# Patient Record
Sex: Male | Born: 1980 | Race: Black or African American | Hispanic: No | Marital: Single | Smoking: Current every day smoker
Health system: Southern US, Community
[De-identification: ages and names within clinical notes are randomized; demographics above are authoritative.]

## PROBLEM LIST (undated history)

## (undated) DIAGNOSIS — K649 Unspecified hemorrhoids: Secondary | ICD-10-CM

## (undated) HISTORY — DX: Unspecified hemorrhoids: K64.9

## (undated) HISTORY — PX: NO PAST SURGERIES: SHX2092

---
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
## 2001-04-29 ENCOUNTER — Emergency Department (HOSPITAL_COMMUNITY): Admission: EM | Admit: 2001-04-29 | Discharge: 2001-04-29 | Payer: Self-pay | Admitting: *Deleted

## 2001-04-29 ENCOUNTER — Encounter: Payer: Self-pay | Admitting: *Deleted

## 2004-11-17 ENCOUNTER — Emergency Department (HOSPITAL_COMMUNITY): Admission: EM | Admit: 2004-11-17 | Discharge: 2004-11-17 | Payer: Self-pay | Admitting: Emergency Medicine

## 2006-02-25 ENCOUNTER — Emergency Department (HOSPITAL_COMMUNITY): Admission: EM | Admit: 2006-02-25 | Discharge: 2006-02-25 | Payer: Self-pay | Admitting: Family Medicine

## 2006-05-07 ENCOUNTER — Emergency Department (HOSPITAL_COMMUNITY): Admission: EM | Admit: 2006-05-07 | Discharge: 2006-05-07 | Payer: Self-pay | Admitting: Family Medicine

## 2015-02-11 ENCOUNTER — Emergency Department (HOSPITAL_COMMUNITY)
Admission: EM | Admit: 2015-02-11 | Discharge: 2015-02-11 | Disposition: A | Discharge status: Home / Self Care | Payer: Medicaid Other | Source: Home / Self Care | Attending: Family Medicine | Admitting: Family Medicine

## 2015-02-11 ENCOUNTER — Other Ambulatory Visit (HOSPITAL_COMMUNITY)
Admission: RE | Admit: 2015-02-11 | Discharge: 2015-02-11 | Disposition: A | Discharge status: Home / Self Care | Payer: Medicaid Other | Source: Ambulatory Visit | Attending: Family Medicine | Admitting: Family Medicine

## 2015-02-11 ENCOUNTER — Encounter (HOSPITAL_COMMUNITY): Payer: Self-pay | Admitting: Emergency Medicine

## 2015-02-11 DIAGNOSIS — Z202 Contact with and (suspected) exposure to infections with a predominantly sexual mode of transmission: Secondary | ICD-10-CM | POA: Diagnosis not present

## 2015-02-11 DIAGNOSIS — Z113 Encounter for screening for infections with a predominantly sexual mode of transmission: Secondary | ICD-10-CM | POA: Insufficient documentation

## 2015-02-11 NOTE — ED Provider Notes (Signed)
CSN: [id number]     Arrival date & time 02/11/15  1416 History   First MD Initiated Contact with Patient 02/11/15 1458     Chief Complaint  Patient presents with  . Exposure to STD   (Consider location/radiation/quality/duration/timing/severity/associated sxs/prior Treatment) HPI Comments: 34 year old male presents because his sexual partner was told she had some sort of bacterial vaginal infection. Patient states he is asymptomatic. He is just wanting to get checked.   History reviewed. No pertinent past medical history. History reviewed. No pertinent past surgical history. No family history on file. Social History  Substance Use Topics  . Smoking status: Current Every Day Smoker    Types: Cigarettes  . Smokeless tobacco: None  . Alcohol Use: Yes    Review of Systems  Constitutional: Negative.   Genitourinary: Negative.  Negative for dysuria, urgency, frequency, discharge, penile swelling, scrotal swelling, penile pain and testicular pain.  Skin: Negative.     Allergies  Review of patient's allergies indicates no known allergies.  Home Medications   Prior to Admission medications   Not on File   Meds Ordered and Administered this Visit  Medications - No data to display  BP 119/76 mmHg  Pulse 87  Temp(Src) 98 F (36.7 C) (Oral)  Resp 16  SpO2 96% No data found.   Physical Exam  Constitutional: He appears well-developed and well-nourished. No distress.  Pulmonary/Chest: Effort normal. No respiratory distress.  Musculoskeletal: Normal range of motion. He exhibits no edema.  Neurological: He is alert. No cranial nerve deficit. He exhibits normal muscle tone.  Skin: Skin is warm and dry.  Psychiatric: He has a normal mood and affect.  Nursing note and vitals reviewed.   ED Course  Procedures (including critical care time)  Labs Review Labs Reviewed  URINE CYTOLOGY ANCILLARY ONLY    Imaging Review No results found.   Visual Acuity Review  Right Eye  Distance:   Left Eye Distance:   Bilateral Distance:    Right Eye Near:   Left Eye Near:    Bilateral Near:         MDM   1. Possible exposure to STD    Urine cytology pending Pt asymptomatic     Hayden Rasmussenavid Tenesia Escudero, NP 02/11/15 1528

## 2015-02-11 NOTE — ED Notes (Signed)
Here to be screened for STD Asymptomatic... A&O x4... No acute distress.

## 2015-02-11 NOTE — Discharge Instructions (Signed)
Safe Sex °Safe sex is about reducing the risk of giving or getting a sexually transmitted disease (STD). STDs are spread through sexual contact involving the genitals, mouth, or rectum. Some STDs can be cured and others cannot. Safe sex can also prevent unintended pregnancies.  °WHAT ARE SOME SAFE SEX PRACTICES? °· Limit your sexual activity to only one partner who is having sex with only you. °· Talk to your partner about his or her past partners, past STDs, and drug use. °· Use a condom every time you have sexual intercourse. This includes vaginal, oral, and anal sexual activity. Both females and males should wear condoms during oral sex. Only use latex or polyurethane condoms and water-based lubricants. Using petroleum-based lubricants or oils to lubricate a condom will weaken the condom and increase the chance that it will break. The condom should be in place from the beginning to the end of sexual activity. Wearing a condom reduces, but does not completely eliminate, your risk of getting or giving an STD. STDs can be spread by contact with infected body fluids and skin. °· Get vaccinated for hepatitis B and HPV. °· Avoid alcohol and recreational drugs, which can affect your judgment. You may forget to use a condom or participate in high-risk sex. °· For females, avoid douching after sexual intercourse. Douching can spread an infection farther into the reproductive tract. °· Check your body for signs of sores, blisters, rashes, or unusual discharge. See your health care provider if you notice any of these signs. °· Avoid sexual contact if you have symptoms of an infection or are being treated for an STD. If you or your partner has herpes, avoid sexual contact when blisters are present. Use condoms at all other times. °· If you are at risk of being infected with HIV, it is recommended that you take a prescription medicine daily to prevent HIV infection. This is called pre-exposure prophylaxis (PrEP). You are  considered at risk if: °· You are a man who has sex with other men (MSM). °· You are a heterosexual man or woman who is sexually active with more than one partner. °· You take drugs by injection. °· You are sexually active with a partner who has HIV. °· Talk with your health care provider about whether you are at high risk of being infected with HIV. If you choose to begin PrEP, you should first be tested for HIV. You should then be tested every 3 months for as long as you are taking PrEP. °· See your health care provider for regular screenings, exams, and tests for other STDs. Before having sex with a new partner, each of you should be screened for STDs and should talk about the results with each other. °WHAT ARE THE BENEFITS OF SAFE SEX?  °· There is less chance of getting or giving an STD. °· You can prevent unwanted or unintended pregnancies. °· By discussing safe sex concerns with your partner, you may increase feelings of intimacy, comfort, trust, and honesty between the two of you. °  °This information is not intended to replace advice given to you by your health care provider. Make sure you discuss any questions you have with your health care provider. °  °Document Released: 05/03/2004 Document Revised: 04/16/2014 Document Reviewed: 09/17/2011 °Elsevier Interactive Patient Education ©2016 Elsevier Inc. ° °Sexually Transmitted Disease °A sexually transmitted disease (STD) is a disease or infection that may be passed (transmitted) from person to person, usually during sexual activity. This may happen by   way of saliva, semen, blood, vaginal mucus, or urine. Common STDs include: °· Gonorrhea. °· Chlamydia. °· Syphilis. °· HIV and AIDS. °· Genital herpes. °· Hepatitis B and C. °· Trichomonas. °· Human papillomavirus (HPV). °· Pubic lice. °· Scabies. °· Mites. °· Bacterial vaginosis. °WHAT ARE CAUSES OF STDs? °An STD may be caused by bacteria, a virus, or parasites. STDs are often transmitted during sexual  activity if one person is infected. However, they may also be transmitted through nonsexual means. STDs may be transmitted after:  °· Sexual intercourse with an infected person. °· Sharing sex toys with an infected person. °· Sharing needles with an infected person or using unclean piercing or tattoo needles. °· Having intimate contact with the genitals, mouth, or rectal areas of an infected person. °· Exposure to infected fluids during birth. °WHAT ARE THE SIGNS AND SYMPTOMS OF STDs? °Different STDs have different symptoms. Some people may not have any symptoms. If symptoms are present, they may include: °· Painful or bloody urination. °· Pain in the pelvis, abdomen, vagina, anus, throat, or eyes. °· A skin rash, itching, or irritation. °· Growths, ulcerations, blisters, or sores in the genital and anal areas. °· Abnormal vaginal discharge with or without bad odor. °· Penile discharge in men. °· Fever. °· Pain or bleeding during sexual intercourse. °· Swollen glands in the groin area. °· Yellow skin and eyes (jaundice). This is seen with hepatitis. °· Swollen testicles. °· Infertility. °· Sores and blisters in the mouth. °HOW ARE STDs DIAGNOSED? °To make a diagnosis, your health care provider may: °· Take a medical history. °· Perform a physical exam. °· Take a sample of any discharge to examine. °· Swab the throat, cervix, opening to the penis, rectum, or vagina for testing. °· Test a sample of your first morning urine. °· Perform blood tests. °· Perform a Pap test, if this applies. °· Perform a colposcopy. °· Perform a laparoscopy. °HOW ARE STDs TREATED? °Treatment depends on the STD. Some STDs may be treated but not cured. °· Chlamydia, gonorrhea, trichomonas, and syphilis can be cured with antibiotic medicine. °· Genital herpes, hepatitis, and HIV can be treated, but not cured, with prescribed medicines. The medicines lessen symptoms. °· Genital warts from HPV can be treated with medicine or by freezing,  burning (electrocautery), or surgery. Warts may come back. °· HPV cannot be cured with medicine or surgery. However, abnormal areas may be removed from the cervix, vagina, or vulva. °· If your diagnosis is confirmed, your recent sexual partners need treatment. This is true even if they are symptom-free or have a negative culture or evaluation. They should not have sex until their health care providers say it is okay. °· Your health care provider may test you for infection again 3 months after treatment. °HOW CAN I REDUCE MY RISK OF GETTING AN STD? °Take these steps to reduce your risk of getting an STD: °· Use latex condoms, dental dams, and water-soluble lubricants during sexual activity. Do not use petroleum jelly or oils. °· Avoid having multiple sex partners. °· Do not have sex with someone who has other sex partners °· Do not have sex with anyone you do not know or who is at high risk for an STD. °· Avoid risky sex practices that can break your skin. °· Do not have sex if you have open sores on your mouth or skin. °· Avoid drinking too much alcohol or taking illegal drugs. Alcohol and drugs can affect your judgment and put   you in a vulnerable position. °· Avoid engaging in oral and anal sex acts. °· Get vaccinated for HPV and hepatitis. If you have not received these vaccines in the past, talk to your health care provider about whether one or both might be right for you. °· If you are at risk of being infected with HIV, it is recommended that you take a prescription medicine daily to prevent HIV infection. This is called pre-exposure prophylaxis (PrEP). You are considered at risk if: °· You are a man who has sex with other men (MSM). °· You are a heterosexual man or woman and are sexually active with more than one partner. °· You take drugs by injection. °· You are sexually active with a partner who has HIV. °· Talk with your health care provider about whether you are at high risk of being infected with HIV. If  you choose to begin PrEP, you should first be tested for HIV. You should then be tested every 3 months for as long as you are taking PrEP. °WHAT SHOULD I DO IF I THINK I HAVE AN STD? °· See your health care provider. °· Tell your sexual partner(s). They should be tested and treated for any STDs. °· Do not have sex until your health care provider says it is okay. °WHEN SHOULD I GET IMMEDIATE MEDICAL CARE? °Contact your health care provider right away if:  °· You have severe abdominal pain. °· You are a man and notice swelling or pain in your testicles. °· You are a woman and notice swelling or pain in your vagina. °  °This information is not intended to replace advice given to you by your health care provider. Make sure you discuss any questions you have with your health care provider. °  °Document Released: 06/16/2002 Document Revised: 04/16/2014 Document Reviewed: 10/14/2012 °Elsevier Interactive Patient Education ©2016 Elsevier Inc. ° °Trichomoniasis °Trichomoniasis is an infection caused by an organism called Trichomonas. The infection can affect both women and men. In women, the outer male genitalia and the vagina are affected. In men, the penis is mainly affected, but the prostate and other reproductive organs can also be involved. Trichomoniasis is a sexually transmitted infection (STI) and is most often passed to another person through sexual contact.  °RISK FACTORS °· Having unprotected sexual intercourse. °· Having sexual intercourse with an infected partner. °SIGNS AND SYMPTOMS  °Symptoms of trichomoniasis in women include: °· Abnormal gray-green frothy vaginal discharge. °· Itching and irritation of the vagina. °· Itching and irritation of the area outside the vagina. °Symptoms of trichomoniasis in men include:  °· Penile discharge with or without pain. °· Pain during urination. This results from inflammation of the urethra. °DIAGNOSIS  °Trichomoniasis may be found during a Pap test or physical exam.  Your health care provider may use one of the following methods to help diagnose this infection: °· Testing the pH of the vagina with a test tape. °· Using a vaginal swab test that checks for the Trichomonas organism. A test is available that provides results within a few minutes. °· Examining a urine sample. °· Testing vaginal secretions. °Your health care provider may test you for other STIs, including HIV. °TREATMENT  °· You may be given medicine to fight the infection. Women should inform their health care provider if they could be or are pregnant. Some medicines used to treat the infection should not be taken during pregnancy. °· Your health care provider may recommend over-the-counter medicines or creams to decrease itching   or irritation.  Your sexual partner will need to be treated if infected.  Your health care provider may test you for infection again 3 months after treatment. HOME CARE INSTRUCTIONS   Take medicines only as directed by your health care provider.  Take over-the-counter medicine for itching or irritation as directed by your health care provider.  Do not have sexual intercourse while you have the infection.  Women should not douche or wear tampons while they have the infection.  Discuss your infection with your partner. Your partner may have gotten the infection from you, or you may have gotten it from your partner.  Have your sex partner get examined and treated if necessary.  Practice safe, informed, and protected sex.  See your health care provider for other STI testing. SEEK MEDICAL CARE IF:   You still have symptoms after you finish your medicine.  You develop abdominal pain.  You have pain when you urinate.  You have bleeding after sexual intercourse.  You develop a rash.  Your medicine makes you sick or makes you throw up (vomit). MAKE SURE YOU:  Understand these instructions.  Will watch your condition.  Will get help right away if you are not  doing well or get worse.   This information is not intended to replace advice given to you by your health care provider. Make sure you discuss any questions you have with your health care provider.   Document Released: 09/19/2000 Document Revised: 04/16/2014 Document Reviewed: 01/05/2013 Elsevier Interactive Patient Education [employer]2016 Elsevier Inc.

## 2015-02-11 NOTE — ED Notes (Signed)
Call back number verified.  

## 2015-02-14 LAB — URINE CYTOLOGY ANCILLARY ONLY
Chlamydia: NEGATIVE
Neisseria Gonorrhea: NEGATIVE
Trichomonas: NEGATIVE

## 2015-02-14 NOTE — ED Notes (Addendum)
Final report of STD testing negative. Negative for GC, chlamydia, trichomonas

## 2015-05-27 ENCOUNTER — Emergency Department (HOSPITAL_COMMUNITY)
Admission: EM | Admit: 2015-05-27 | Discharge: 2015-05-27 | Disposition: A | Discharge status: Home / Self Care | Payer: Medicaid Other | Attending: Emergency Medicine | Admitting: Emergency Medicine

## 2015-05-27 ENCOUNTER — Encounter (HOSPITAL_COMMUNITY): Payer: Self-pay | Admitting: Emergency Medicine

## 2015-05-27 DIAGNOSIS — F17210 Nicotine dependence, cigarettes, uncomplicated: Secondary | ICD-10-CM | POA: Insufficient documentation

## 2015-05-27 DIAGNOSIS — R0789 Other chest pain: Secondary | ICD-10-CM | POA: Diagnosis not present

## 2015-05-27 DIAGNOSIS — R079 Chest pain, unspecified: Secondary | ICD-10-CM | POA: Diagnosis present

## 2015-05-27 LAB — I-STAT TROPONIN, ED: TROPONIN I, POC: 0 ng/mL (ref 0.00–0.08)

## 2015-05-27 NOTE — Discharge Instructions (Signed)
There does not appear to be an emergent cause for your symptoms at this time. Your exam, EKG and lab work were reassuring. Please follow-up with your doctor in the next 2-3 days for reevaluation. Return to ED for new or worsening symptoms as we discussed.  Chest Wall Pain Chest wall pain is pain in or around the bones and muscles of your chest. Sometimes, an injury causes this pain. Sometimes, the cause may not be known. This pain may take several weeks or longer to get better. HOME CARE INSTRUCTIONS  Pay attention to any changes in your symptoms. Take these actions to help with your pain:   Rest as told by your health care provider.   Avoid activities that cause pain. These include any activities that use your chest muscles or your abdominal and side muscles to lift heavy items.   If directed, apply ice to the painful area:  Put ice in a plastic bag.  Place a towel between your skin and the bag.  Leave the ice on for 20 minutes, 2-3 times per day.  Take over-the-counter and prescription medicines only as told by your health care provider.  Do not use tobacco products, including cigarettes, chewing tobacco, and e-cigarettes. If you need help quitting, ask your health care provider.  Keep all follow-up visits as told by your health care provider. This is important. SEEK MEDICAL CARE IF:  You have a fever.  Your chest pain becomes worse.  You have new symptoms. SEEK IMMEDIATE MEDICAL CARE IF:  You have nausea or vomiting.  You feel sweaty or light-headed.  You have a cough with phlegm (sputum) or you cough up blood.  You develop shortness of breath.   This information is not intended to replace advice given to you by your health care provider. Make sure you discuss any questions you have with your health care provider.   Document Released: 03/26/2005 Document Revised: 12/15/2014 Document Reviewed: 06/21/2014 Elsevier Interactive Patient Education [employer]

## 2015-05-27 NOTE — ED Notes (Signed)
Patient states that his chest pain started yesterday at the gym.  He states that it started with a sharp pain, now it is on and off for the last 12 hours since.  Patient states that he did not work out any harder than usual.  No shortness of breath.

## 2015-05-27 NOTE — ED Provider Notes (Signed)
CSN: [id number]     Arrival date & time 05/27/15  0601 History   First MD Initiated Contact with Patient 05/27/15 [phone]     Chief Complaint  Patient presents with  . Chest Pain     (Consider location/radiation/quality/duration/timing/severity/associated sxs/prior Treatment) HPI Chase Hawkins is a 35 y.o. male because in for evaluation of chest discomfort. Patient reports yesterday, while at the gym and doing bench press he experienced sudden onset left-sided chest discomfort. He reports sensation is repeated and worse with left arm movement. Rates discomfort as mild. Denies any associated shortness of breath, nausea or vomiting, dizziness, numbness or weakness. He has not tried anything to improve symptoms. He reports he does smoke 10 cigarettes per day. No recent travel, surgery/immobilizations, leg swelling, history of blood clot. No fevers, cough. No other alleviating or aggravating factors. Denies history of hypertension, hyperlipidemia, diabetes, family history of heart disease.  History reviewed. No pertinent past medical history. History reviewed. No pertinent past surgical history. No family history on file. Social History  Substance Use Topics  . Smoking status: Current Every Day Smoker    Types: Cigarettes  . Smokeless tobacco: None  . Alcohol Use: Yes    Review of Systems A 10 point review of systems was completed and was negative except for pertinent positives and negatives as mentioned in the history of present illness     Allergies  Review of patient's allergies indicates no known allergies.  Home Medications   Prior to Admission medications   Not on File   BP 127/69 mmHg  Pulse 57  Temp(Src) 98.2 F (36.8 C) (Oral)  Resp 15  Ht  (1.854 m)  Wt 98.884 kg  BMI 28.77 kg/m2  SpO2 99% Physical Exam  Constitutional: He is oriented to person, place, and time. He appears well-developed and well-nourished.  HENT:  Head: Normocephalic and atraumatic.    Mouth/Throat: Oropharynx is clear and moist.  Eyes: Conjunctivae are normal. Pupils are equal, round, and reactive to light. Right eye exhibits no discharge. Left eye exhibits no discharge. No scleral icterus.  Neck: Neck supple.  Cardiovascular: Normal rate, regular rhythm and normal heart sounds.   Pulmonary/Chest: Effort normal and breath sounds normal. No respiratory distress. He has no wheezes. He has no rales. He exhibits tenderness.    Tenderness diffusely throughout the left pectoralis major muscle. Sensation is exactly replicated with activation of packed musculature.  Abdominal: Soft. There is no tenderness.  Musculoskeletal: He exhibits no tenderness.  Neurological: He is alert and oriented to person, place, and time.  Cranial Nerves II-XII grossly intact  Skin: Skin is warm and dry. No rash noted.  Psychiatric: He has a normal mood and affect.  Nursing note and vitals reviewed.   ED Course  Procedures (including critical care time) Labs Review Labs Reviewed  Rosezena Sensor, ED    Imaging Review No results found. I have personally reviewed and evaluated these images and lab results as part of my medical decision-making.   EKG Interpretation   Date/Time:  Friday May 27 2015 06:10:14 EST Ventricular Rate:  69 PR Interval:  168 QRS Duration: 96 QT Interval:  390 QTC Calculation: 417 R Axis:   78 Text Interpretation:  Normal sinus rhythm with sinus arrhythmia Normal ECG  No old tracing to compare Confirmed by [facility]  MD, DAVID ([id number]) on  05/27/2015 6:22:12 AM     Meds given in ED:  Medications - No data to display  There are no discharge medications  for this patient.  Filed Vitals:   05/27/15 0700 05/27/15 0730 05/27/15 0745 05/27/15 0815  BP: 132/76 121/62 127/65 127/69  Pulse: 63 72 67 57  Temp:      TempSrc:      Resp: Height:      Weight:      SpO2: 99% 99% 100% 99%    MDM  Chase Hawkins is a 35 y.o. male with no  significant past medical history comes in for evaluation of musculoskeletal left chest wall pain. Discomfort started with bench press, worse with left pectoralis muscle activity. No other concerning symptoms. Clinical presentation not consistent with ACS, other acute cardiopulmonary pathology, PE. Low risk per heart score. We'll treat for musculoskeletal pain with shortness course NSAIDs and symptomatic therapy at home. Patient states he is a patient of family practice and will follow up with PCP next week. The patient appears reasonably screened and/or stabilized for discharge and I doubt any other medical condition or other [facility] requiring further screening, evaluation, or treatment in the ED at this time prior to discharge.   Final diagnoses:  Anterior chest wall pain        Joycie Peek, PA-C 05/27/15 0827  Dione Booze, MD 05/29/15 2308

## 2015-05-27 NOTE — ED Notes (Signed)
Per pt, pain worse with left arm movements.

## 2015-07-01 ENCOUNTER — Telehealth: Payer: Self-pay | Admitting: *Deleted

## 2015-07-01 ENCOUNTER — Encounter (HOSPITAL_COMMUNITY): Payer: Self-pay | Admitting: *Deleted

## 2015-07-01 ENCOUNTER — Emergency Department (HOSPITAL_COMMUNITY)
Admission: EM | Admit: 2015-07-01 | Discharge: 2015-07-01 | Disposition: A | Discharge status: Home / Self Care | Payer: Medicaid Other | Attending: Emergency Medicine | Admitting: Emergency Medicine

## 2015-07-01 DIAGNOSIS — F17210 Nicotine dependence, cigarettes, uncomplicated: Secondary | ICD-10-CM | POA: Diagnosis not present

## 2015-07-01 DIAGNOSIS — R21 Rash and other nonspecific skin eruption: Secondary | ICD-10-CM | POA: Diagnosis present

## 2015-07-01 DIAGNOSIS — L259 Unspecified contact dermatitis, unspecified cause: Secondary | ICD-10-CM

## 2015-07-01 DIAGNOSIS — L232 Allergic contact dermatitis due to cosmetics: Secondary | ICD-10-CM | POA: Diagnosis not present

## 2015-07-01 MED ORDER — BETAMETHASONE DIPROPIONATE 0.05 % EX OINT: TOPICAL_OINTMENT | Freq: Two times a day (BID) | CUTANEOUS | Status: DC

## 2015-07-01 NOTE — ED Notes (Signed)
Pt feels he is having an allergic reaction to a hair product used by his barber on Monday. Itching rash with weeping to scalp and face. No respiratory distress.

## 2015-07-01 NOTE — ED Notes (Signed)
Pt reports rash to face and head, thinks its allergic reaction from getting hair cut on Monday. Also states similar episode happened a few weeks ago. Took benadryl with no relief. No acute distress noted at triage.

## 2015-07-01 NOTE — Discharge Instructions (Signed)
Please read and follow all provided instructions.  Your diagnoses today include:  1. Contact dermatitis    Tests performed today include:  Vital signs. See below for your results today.   Medications prescribed:   Take as prescribed   Home care instructions:  Follow any educational materials contained in this packet.  Follow-up instructions: Please follow-up with your primary care provider in the next week for further evaluation of symptoms and treatment   Return instructions:   Please return to the Emergency Department if you do not get better, if you get worse, or new symptoms OR  - Fever (temperature greater than 101.1F)  - Bleeding that does not stop with holding pressure to the area    -Severe pain (please note that you may be more sore the day after your accident)  - Chest Pain  - Difficulty breathing  - Severe nausea or vomiting  - Inability to tolerate food and liquids  - Passing out  - Skin becoming red around your wounds  - Change in mental status (confusion or lethargy)  - New numbness or weakness     Please return if you have any other emergent concerns.  Additional Information:  Your vital signs today were: BP 123/66 mmHg   Pulse 66   Temp(Src) 98.1 F (36.7 C) (Oral)   Resp 18   SpO2 98% If your blood pressure (BP) was elevated above 135/85 this visit, please have this repeated by your doctor within one month. ---------------

## 2015-07-01 NOTE — ED Provider Notes (Signed)
CSN: [id number]     Arrival date & time 07/01/15  9562 History  By signing my name below, I, Linus Galas, attest that this documentation has been prepared under the direction and in the presence of Audry Pili, PA-C. Electronically Signed: Linus Galas, ED Scribe. 07/01/2015. 9:32 AM.    Chief Complaint  Patient presents with  . Allergic Reaction    The history is provided by the patient. No language interpreter was used.   HPI Comments: Chase Hawkins is a 35 y.o. male who presents to the Emergency Department with no pertinent PMHx for an evaluation for a possible allergic reaction that began 4 days ago. Pt states he went to the barbershop 4 days ago and reports the hair spray that was used caused itching. He tried washing his head for 2 days but no relief. 2 days ago he began to have a facial rash. A day later, he felt "mositure" when he rubbed his head and noted discharge. Pt took Benadryl last night with no relief. Pt denies any fevers, nausea, vomiting.   History reviewed. No pertinent past medical history. History reviewed. No pertinent past surgical history. History reviewed. No pertinent family history. Social History  Substance Use Topics  . Smoking status: Current Every Day Smoker    Types: Cigarettes  . Smokeless tobacco: None  . Alcohol Use: Yes    Review of Systems A complete 10 system review of systems was obtained and all systems are negative except as noted in the HPI and PMH.   Allergies  Review of patient's allergies indicates no known allergies.  Home Medications   Prior to Admission medications   Not on File   BP 123/66 mmHg  Pulse 66  Temp(Src) 98.1 F (36.7 C) (Oral)  Resp 18  SpO2 98%   Physical Exam  Constitutional: He is oriented to person, place, and time. He appears well-developed and well-nourished.  HENT:  Head: Normocephalic and atraumatic.  No airway compromise. No swelling noted.    Eyes: Conjunctivae and EOM are normal. Pupils  are equal, round, and reactive to light.  Neck: Normal range of motion. Neck supple.  Cardiovascular: Normal rate.   Pulmonary/Chest: Effort normal.  Abdominal: Soft. He exhibits no distension.  Musculoskeletal: Normal range of motion.  Neurological: He is alert and oriented to person, place, and time.  Skin: Skin is warm and dry. Rash noted. Rash is urticarial.  Urticarial rash noted on scalp and forehead. Erythematous. Non purulent   Psychiatric: He has a normal mood and affect. His behavior is normal. Thought content normal.  Nursing note and vitals reviewed.   ED Course  Procedures   DIAGNOSTIC STUDIES: Oxygen Saturation is 98% on room air, normal by my interpretation.    COORDINATION OF CARE: 9:24 AM Discussed treatment plan with pt at bedside and pt agreed to plan.   MDM  I have reviewed the relevant previous healthcare records. I obtained HPI from historian.  ED Course:  Pt is a 34yM who presents with contact dermatitis on scalp after new hair product. On exam, pt in NAD. Nontoxic/nonseptic appearing. VSS. Afebrile. Lungs CTA. Heart RRR. No airway compromise. No trouble breathing. Urticarial rash noted on scalp. Nonpurulent. Erythematous. DC home with topical steroid. Follow up with PCP for further management. At time of discharge, Patient is in no acute distress. Vital Signs are stable. Patient is able to ambulate. Patient able to tolerate PO.    Disposition/Plan:  DC Home Additional Verbal discharge instructions given and discussed  with patient.  Pt Instructed to f/u with PCP in the next week for evaluation and treatment of symptoms. Return precautions given Pt acknowledges and agrees with plan  Supervising Physician Vanetta MuldersScott Zackowski, MD   Final diagnoses:  Contact dermatitis    I personally performed the services described in this documentation, which was scribed in my presence. The recorded information has been reviewed and is accurate.   Audry Piliyler Lasha Echeverria,  PA-C 07/01/15 [id number]  Vanetta MuldersScott Zackowski, MD 07/01/15 1037

## 2015-07-01 NOTE — Telephone Encounter (Signed)
Pharmacy called related to Rx: betamethasone dipropionate (DIPROLENE) 0.05 % ointment not being covered by insurance  .Marland Kitchen.Marland Kitchen.EDCM clarified with Pharm D to change Rx to: Cloderm, which is covered.

## 2015-10-10 ENCOUNTER — Ambulatory Visit (HOSPITAL_COMMUNITY)
Admission: EM | Admit: 2015-10-10 | Discharge: 2015-10-10 | Disposition: A | Discharge status: Home / Self Care | Payer: Medicaid Other | Attending: Family Medicine | Admitting: Family Medicine

## 2015-10-10 ENCOUNTER — Encounter (HOSPITAL_COMMUNITY): Payer: Self-pay | Admitting: Emergency Medicine

## 2015-10-10 DIAGNOSIS — L259 Unspecified contact dermatitis, unspecified cause: Secondary | ICD-10-CM | POA: Diagnosis not present

## 2015-10-10 MED ORDER — PREDNISONE 10 MG PO TABS: ORAL_TABLET | ORAL | Status: DC

## 2015-10-10 MED ORDER — METHYLPREDNISOLONE SODIUM SUCC 125 MG IJ SOLR
INTRAMUSCULAR | Status: AC
  Filled 2015-10-10: qty 2

## 2015-10-10 MED ORDER — METHYLPREDNISOLONE SODIUM SUCC 125 MG IJ SOLR
125.0000 mg | Freq: Once | INTRAMUSCULAR | Status: AC
  Administered 2015-10-10: 125 mg via INTRAMUSCULAR

## 2015-10-10 NOTE — ED Notes (Signed)
[facility] a [facility] of [facility] rash on his arms and legs that started while he was doing yard work 5 days ago.

## 2015-10-10 NOTE — Discharge Instructions (Signed)

## 2015-10-10 NOTE — ED Provider Notes (Signed)
CSN: [id number]     Arrival date & time 10/10/15  1101 History   First MD Initiated Contact with Patient 10/10/15 1213     Chief Complaint  Patient presents with  . Rash   (Consider location/radiation/quality/duration/timing/severity/associated sxs/prior Treatment) HPI History obtained from patient: Location:  Arms and legs Context/Duration: Several days exposed while working outside in the weeds  Severity: No pain  Quality: Itching Timing:           Constant Home Treatment: IVy dry Associated symptoms: Severe itching  Family History: Diabetes    History reviewed. No pertinent past medical history. History reviewed. No pertinent past surgical history. History reviewed. No pertinent family history. Social History  Substance Use Topics  . Smoking status: Current Every Day Smoker    Types: Cigarettes  . Smokeless tobacco: None  . Alcohol Use: Yes    Review of Systems  Denies: HEADACHE, NAUSEA, ABDOMINAL PAIN, CHEST PAIN, CONGESTION, DYSURIA, SHORTNESS OF BREATH  Allergies  Review of patient's allergies indicates no known allergies.  Home Medications   Prior to Admission medications   Medication Sig Start Date End Date Taking? Authorizing Provider  betamethasone dipropionate (DIPROLENE) 0.05 % ointment Apply topically 2 (two) times daily. 07/01/15   Audry Piliyler Mohr, PA-C  predniSONE (DELTASONE) 10 MG tablet Sig: 4 tables once a day for 3 days, 3 tablets once a day X3 days, 2 tablets a day for 3 days, 1 tablet a day for 3 days. 10/10/15   Tharon AquasFrank C Patrick, PA   Meds Ordered and Administered this Visit   Medications  methylPREDNISolone sodium succinate (SOLU-MEDROL) 125 mg/2 mL injection 125 mg (125 mg Intramuscular Given 10/10/15 1320)  Administered prior to discharge  BP 112/57 mmHg  Pulse 60  Temp(Src) 98 F (36.7 C) (Oral)  Resp 12  SpO2 100% No data found.   Physical Exam NURSES NOTES AND VITAL SIGNS REVIEWED. CONSTITUTIONAL: Well developed, well nourished, no acute  distress HEENT: normocephalic, atraumatic EYES: Conjunctiva normal NECK:normal ROM, supple, no adenopathy PULMONARY:No respiratory distress, normal effort ABDOMINAL: Soft, ND, NT BS+, No CVAT MUSCULOSKELETAL: Normal ROM of all extremities,  SKIN: warm and dry skin rashes consistent with some exposure to plant dermatitis. PSYCHIATRIC: Mood and affect, behavior are normal  ED Course  Procedures (including critical care time)  Labs Review Labs Reviewed - No data to display  Imaging Review No results found.   Visual Acuity Review  Right Eye Distance:   Left Eye Distance:   Bilateral Distance:    Right Eye Near:   Left Eye Near:    Bilateral Near:       Prescription for prednisone taper  MDM   1. Contact dermatitis     Patient is reassured that there are no issues that require transfer to higher level of care at this time or additional tests. Patient is advised to continue home symptomatic treatment. Patient is advised that if there are new or worsening symptoms to attend the emergency department, contact primary care provider, or return to UC. Instructions of care provided discharged home in stable condition.    THIS NOTE WAS GENERATED USING A VOICE RECOGNITION SOFTWARE PROGRAM. ALL REASONABLE EFFORTS  WERE MADE TO PROOFREAD THIS DOCUMENT FOR ACCURACY.  I have verbally reviewed the discharge instructions with the patient. A printed AVS was given to the patient.  All questions were answered prior to discharge.      Tharon AquasFrank C Patrick, PA 10/10/15 1950

## 2015-10-24 ENCOUNTER — Encounter: Payer: Self-pay | Admitting: Podiatry

## 2015-10-24 ENCOUNTER — Ambulatory Visit (INDEPENDENT_AMBULATORY_CARE_PROVIDER_SITE_OTHER): Payer: Medicaid Other | Admitting: Podiatry

## 2015-10-24 ENCOUNTER — Ambulatory Visit (INDEPENDENT_AMBULATORY_CARE_PROVIDER_SITE_OTHER): Payer: Medicaid Other

## 2015-10-24 VITALS — BP 106/63 | HR 59 | Resp 18

## 2015-10-24 DIAGNOSIS — M2010 Hallux valgus (acquired), unspecified foot: Secondary | ICD-10-CM | POA: Diagnosis not present

## 2015-10-24 DIAGNOSIS — M2040 Other hammer toe(s) (acquired), unspecified foot: Secondary | ICD-10-CM

## 2015-10-24 DIAGNOSIS — R52 Pain, unspecified: Secondary | ICD-10-CM

## 2015-10-24 NOTE — Patient Instructions (Signed)

## 2015-10-24 NOTE — Progress Notes (Signed)
   Subjective:    Patient ID: Yong ChannelWilliam V Heng, male    DOB: [birth year], 35 y.o.   MRN: [id number]  HPI  35 year old male presents the office today for concerns of his big toes going to the right on both sides of his right side for the left. He states on the right-sided second and third toes are starting to rub as the big toe is pushing them out. He said no previous treatment for this. Denies any recent injury or trauma. No swelling or redness. No numbness or tingling.  Review of Systems  All other systems reviewed and are negative.      Objective:   Physical Exam General: AAO x3, NAD  Dermatological: Small have a keratotic lesion present in between the second and third toes. No underlying ulceration. This no edema, erythema. No other lesions or pre-ulceration or lesions identified at this time.  Vascular: Dorsalis Pedis artery and Posterior Tibial artery pedal pulses are 2/4 bilateral with immedate capillary fill time. Pedal hair growth present. There is no pain with calf compression, swelling, warmth, erythema.   Neruologic: Grossly intact via light touch bilateral. Vibratory intact via tuning fork bilateral. Protective threshold with Semmes Wienstein monofilament intact to all pedal sites bilateral.   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 The second digit on the right-sided certain overlap the hallux. There is irritation on the medial aspect of the first metatarsal head from shoe gear. There is mild hypermobility present on the right side. There is no pain or crepitation with first MTPJ range of motion bilaterally. No other areas of tenderness bilaterally. MMT 5/5, ROM WNL.   Gait: Unassisted, Nonantalgic.      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 HAV is present. No evidence of acute fracture. -I discussed both conservative and surgical treatment options. This point he said no conservative treatment. Discussed him shoe gear modifications, offloading padding as well as orthotics. If symptoms continue to proceed with surgery. Discussed with him on the right side Lapidus bunionectomy and to repair of the second and third toes. He likely have a staph of the first year if needed. -Follow-up next several months at his discretion or sooner if any issues are to arise. I encouraged him to call with any questions, concerns or any change in symptoms.  Ovid CurdMatthew Marry Kusch, DPM

## 2015-12-13 ENCOUNTER — Emergency Department (HOSPITAL_COMMUNITY): Payer: Medicaid Other

## 2015-12-13 ENCOUNTER — Emergency Department (HOSPITAL_COMMUNITY)
Admission: EM | Admit: 2015-12-13 | Discharge: 2015-12-13 | Disposition: A | Discharge status: Home / Self Care | Payer: Medicaid Other | Attending: Emergency Medicine | Admitting: Emergency Medicine

## 2015-12-13 ENCOUNTER — Encounter (HOSPITAL_COMMUNITY): Payer: Self-pay

## 2015-12-13 DIAGNOSIS — M5412 Radiculopathy, cervical region: Secondary | ICD-10-CM | POA: Insufficient documentation

## 2015-12-13 DIAGNOSIS — F17210 Nicotine dependence, cigarettes, uncomplicated: Secondary | ICD-10-CM | POA: Diagnosis not present

## 2015-12-13 DIAGNOSIS — M79602 Pain in left arm: Secondary | ICD-10-CM | POA: Diagnosis present

## 2015-12-13 LAB — BASIC METABOLIC PANEL
ANION GAP: 5 (ref 5–15)
BUN: 10 mg/dL (ref 6–20)
CALCIUM: 9.6 mg/dL (ref 8.9–10.3)
CO2: 24 mmol/L (ref 22–32)
Chloride: 109 mmol/L (ref 101–111)
Creatinine, Ser: 1.12 mg/dL (ref 0.61–1.24)
GFR calc Af Amer: >60 mL/min (ref >60)
GLUCOSE: 88 mg/dL (ref 65–99)
Potassium: 3.9 mmol/L (ref 3.5–5.1)
SODIUM: 138 mmol/L (ref 135–145)

## 2015-12-13 LAB — CBC
HCT: 44.8 % (ref 39.0–52.0)
HEMOGLOBIN: 15 g/dL (ref 13.0–17.0)
MCH: 30.2 pg (ref 26.0–34.0)
MCHC: 33.5 g/dL (ref 30.0–36.0)
MCV: 90.3 fL (ref 78.0–100.0)
Platelets: 238 10*3/uL (ref 150–400)
RBC: 4.96 MIL/uL (ref 4.22–5.81)
RDW: 13.1 % (ref 11.5–15.5)
WBC: 12.5 10*3/uL — AB (ref 4.0–10.5)

## 2015-12-13 LAB — I-STAT TROPONIN, ED: TROPONIN I, POC: 0 ng/mL (ref 0.00–0.08)

## 2015-12-13 MED ORDER — IBUPROFEN 800 MG PO TABS
800.0000 mg | ORAL_TABLET | Freq: Once | ORAL | Status: AC
  Administered 2015-12-13: 800 mg via ORAL
  Filled 2015-12-13: qty 1

## 2015-12-13 NOTE — ED Notes (Signed)
Patient c/o of left arm pain. Started early this week with a numb, throbbing feeling in the forearm and now the pain has moved into the upper arm rated 8 on a scale of 0-10. Denies working out at the gym when the pain started. Patient states he does do lawn care with a push mower. Denies any other pain. Denies any significant hx.

## 2015-12-13 NOTE — ED Provider Notes (Signed)
[facility]-EMERGENCY DEPT Provider Note   CSN: [id number] Arrival date & time: 12/13/15  1131     History   Chief Complaint Chief Complaint  Patient presents with  . Arm Pain    HPI  Blood pressure 121/78, pulse 61, temperature 98.7 F (37.1 C), temperature source Oral, resp. rate 18, SpO2 99 %.  Chase Hawkins is a 35 y.o. male complaining of Left arm pins and needles paresthesia onset 1 week ago, patient is right-hand dominant, works in Aeronautical engineer. He states that the peers these is exacerbated by certain positions and movement, states initially it was in the lower arm and it moved upwards towards the posterior shoulder, states that the pain in the posterior shoulder is constant and moderate, no pain medication taken prior to arrival. Rates his pain at 8 out of 10. States that it occasionally radiates to the left jaw. This is not associated with any neck pain, weakness, dropping objects, fever, spinal instrumentation, chest pain, shortness of breath. He denies history of diabetes, hypertension, hyperlipidemia, cocaine, methamphetamine use, family history of ACS.   HPI  History reviewed. No pertinent past medical history.  Patient Active Problem List   Diagnosis Date Noted  . 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 No pertinent surgical history.     Home Medications    Prior to Admission medications   Medication Sig Start Date End Date Taking? Authorizing Provider  betamethasone dipropionate (DIPROLENE) 0.05 % ointment Apply topically 2 (two) times daily. Patient not taking: Reported on 10/24/2015 07/01/15   Audry Pili, PA-C  predniSONE (DELTASONE) 10 MG tablet Sig: 4 tables once a day for 3 days, 3 tablets once a day X3 days, 2 tablets a day for 3 days, 1 tablet a day for 3 days. Patient not taking: Reported on 10/24/2015 10/10/15   Tharon Aquas, PA    Family History History reviewed. No pertinent family history.  Social  History Social History  Substance Use Topics  . Smoking status: Current Every Day Smoker    Packs/day: 1.00    Types: Cigarettes  . Smokeless tobacco: Never Used  . Alcohol use No     Allergies   Review of patient's allergies indicates no known allergies.   Review of Systems Review of Systems  10 systems reviewed and found to be negative, except as noted in the HPI.   Physical Exam Updated Vital Signs BP 121/78 (BP Location: Right Arm)   Pulse 61   Temp 98.7 F (37.1 C) (Oral)   Resp 18   SpO2 99%   Physical Exam  Constitutional: He is oriented to person, place, and time. He appears well-developed and well-nourished. No distress.  HENT:  Head: Normocephalic and atraumatic.  Mouth/Throat: Oropharynx is clear and moist.  Eyes: Conjunctivae and EOM are normal. Pupils are equal, round, and reactive to light.  Neck: Normal range of motion.  No midline C-spine  tenderness to palpation or step-offs appreciated. Patient has full range of motion without pain.  Grip strength, biceps, triceps 5/5 bilaterally;  can differentiate between pinprick and light touch bilaterally.   Spurling test negative.  Left trapezius muscle spasm with mild tenderness to palpation.  Cardiovascular: Normal rate, regular rhythm and intact distal pulses.   Pulmonary/Chest: Effort normal and breath sounds normal.  Abdominal: Soft. There is no tenderness.  Musculoskeletal: Normal range of motion.  Neurological: He is alert and oriented to person, place, and time.  Follows commands, Clear, goal  oriented speech, Strength is 5 out of 5x4 extremities, patient ambulates with a coordinated in nonantalgic gait. Sensation is grossly intact.   Skin: He is not diaphoretic.  Psychiatric: He has a normal mood and affect.  Nursing note and vitals reviewed.    ED Treatments / Results  Labs (all labs ordered are listed, but only abnormal results are displayed) Labs Reviewed  CBC - Abnormal; Notable for the  following:       Result Value   WBC 12.5 (*)    All other components within normal limits  BASIC METABOLIC PANEL  I-STAT TROPOININ, ED    EKG  EKG Interpretation None       Radiology Dg Chest 2 View  Result Date: 12/13/2015 CLINICAL DATA:  Chest pain EXAM: CHEST  2 VIEW COMPARISON:  None. FINDINGS: The heart size and mediastinal contours are within normal limits. Both lungs are clear. The visualized skeletal structures are unremarkable. IMPRESSION: No active cardiopulmonary disease. Electronically Signed   By: Marlan Palauharles  Clark M.D.   On: 12/13/2015 12:00    Procedures Procedures (including critical care time)  Medications Ordered in ED Medications - No data to display   Initial Impression / Assessment and Plan / ED Course  I have reviewed the triage vital signs and the nursing notes.  Pertinent labs & imaging results that were available during my care of the patient were reviewed by me and considered in my medical decision making (see chart for details).  Clinical Course    Vitals:   12/13/15 1330 12/13/15 1331 12/13/15 1351 12/13/15 1400  BP: 121/78 121/78  129/88  Pulse: 61 61  (!) 57  Resp: 17     Temp:  98.7 F (37.1 C)    TempSrc:  Oral    SpO2: 98% 99%  98%  Weight:   90.7 kg   Height:   6\' 1"  (1.854 m)     Medications  ibuprofen (ADVIL,MOTRIN) tablet 800 mg (800 mg Oral Given 12/13/15 1437)    Chase Hawkins is 35 y.o. male presenting with Left (nondominant) arm paresthesia with no weakness he also some muscle spasm in the left trapezius. Spurling test is negative, neurologic exam is otherwise nonfocal, triage initiated cardiac workup negative.  CT of cervical spine with some mild degenerative disc disease, there is a broad-based disc osteophyte and comp looks bilateral degenerative change at C4-C5. Given this patient's reassuring neurologic exam with no weakness, I think he is safe to follow-up as an outpatient, advised him he can either follow [facility] or see orthopedist Dr. Victorino DikeHewitt. Recommend high-dose anti-inflammatories.  Evaluation does not show pathology that would require ongoing emergent intervention or inpatient treatment. Pt is hemodynamically stable and mentating appropriately. Discussed findings and plan with patient/guardian, who agrees with care plan. All questions answered. Return precautions discussed and outpatient follow up given.   Final Clinical Impressions(s) / ED Diagnoses   Final diagnoses:  Cervical radicular pain    New Prescriptions Discharge Medication List as of 12/13/2015  2:47 PM       Wynetta Emeryicole Fianna Snowball, PA-C 12/13/15 1530    Lavera Guiseana Duo Liu, MD 12/13/15 1911

## 2015-12-13 NOTE — ED Notes (Signed)
Patient transported to CT 

## 2015-12-13 NOTE — Discharge Instructions (Signed)
For pain control please take ibuprofen (also known as Motrin or Advil) 800mg (this is normally 4 over the counter pills) 3 times a day  for 5 days. Take with food to minimize stomach irritation. ° °Do not hesitate to return to the emergency room for any new, worsening or concerning symptoms. ° °Please obtain primary care using resource guide below. Let them know that you were seen in the emergency room and that they will need to obtain records for further outpatient management. ° ° ° °

## 2015-12-13 NOTE — ED Triage Notes (Signed)
Onset 1 week intermittant left arm numbness and tingling.  Onset past few days jaw pain.  No chest pain, nausea, dizziness or sweating.

## 2015-12-20 ENCOUNTER — Emergency Department (HOSPITAL_COMMUNITY)
Admission: EM | Admit: 2015-12-20 | Discharge: 2015-12-20 | Disposition: A | Discharge status: Home / Self Care | Payer: Medicaid Other | Attending: Emergency Medicine | Admitting: Emergency Medicine

## 2015-12-20 ENCOUNTER — Encounter (HOSPITAL_COMMUNITY): Payer: Self-pay | Admitting: Emergency Medicine

## 2015-12-20 DIAGNOSIS — M79602 Pain in left arm: Secondary | ICD-10-CM | POA: Diagnosis not present

## 2015-12-20 DIAGNOSIS — F17210 Nicotine dependence, cigarettes, uncomplicated: Secondary | ICD-10-CM | POA: Diagnosis not present

## 2015-12-20 DIAGNOSIS — M5030 Other cervical disc degeneration, unspecified cervical region: Secondary | ICD-10-CM

## 2015-12-20 DIAGNOSIS — M5412 Radiculopathy, cervical region: Secondary | ICD-10-CM

## 2015-12-20 DIAGNOSIS — M50121 Cervical disc disorder at C4-C5 level with radiculopathy: Secondary | ICD-10-CM | POA: Diagnosis not present

## 2015-12-20 MED ORDER — MELOXICAM 15 MG PO TABS: 15.0000 mg | ORAL_TABLET | Freq: Every day | ORAL | 0 refills | Status: DC

## 2015-12-20 MED ORDER — DEXAMETHASONE SODIUM PHOSPHATE 10 MG/ML IJ SOLN
10.0000 mg | Freq: Once | INTRAMUSCULAR | Status: AC
  Administered 2015-12-20: 10 mg via INTRAMUSCULAR
  Filled 2015-12-20: qty 1

## 2015-12-20 NOTE — Discharge Instructions (Signed)
Take mobic daily as directed, do not take additional ibuprofen or NSAIDs,  and you may take additional tylenol for breakthrough pain. Use heat to the area of soreness, and massage the areas gently using a tennis ball. Use a heat pack for no more than 20 minutes at a time every hour. Follow up with the orthopedist you were referred to previously for ongoing management of your pain/condition. Return to ER for emergent changing or worsening of symptoms.

## 2015-12-20 NOTE — ED Notes (Signed)
"  I'm here for something for pain to hold me over until my appointment Friday", with Ortho. C/O left neck, shoulder pain with "tingling" to left forearm.

## 2015-12-20 NOTE — ED Triage Notes (Signed)
Pt was worked up Tuesday for arm, shoulder, chest and back pain. States he was sent home with ibuprofen and not getting better. Has appt Friday with orthopedic that he set up. Hurts with no movement but worse with movement. Burning, tingling, nerve pain. Been taking 800mg  x 3 day.

## 2015-12-20 NOTE — ED Provider Notes (Signed)
[facility] DEPT Provider Note   CSN: [id number] Arrival date & time: 12/20/15  1044  By signing my name below, I, Placido SouLogan Joldersma, attest that this documentation has been prepared under the direction and in the presence of Janie Capp Camprubi-Soms, PA-C. Electronically Signed: Placido SouLogan Joldersma, ED Scribe. 12/20/15. 12:32 PM.   History   Chief Complaint Chief Complaint  Patient presents with  . Arm Pain    HPI HPI Comments: Chase Hawkins is a 35 y.o. male who presents to the Emergency Department complaining of ongoing LUE arm pain that he describes as 10/10 constant, sore/achy/throbbing pain in the L shoulder/trapezius region, ongoing x  1 month. Pt was seen on 12/13/2015 for left arm paresthesias and pain, had a CT performed of the c-spine that found DDD and osteophytes of the C4-5 region, was d/c'd with 800 mg ibuprofen which he is taking 3x daily. He has an appointment scheduled with his orthopaedic referral in 3 days. He states he works in [employer] and his pain worsened last week after working but says really it's been going on for about a month. His pain begins in his left shoulder and radiates down his LUE. He reports intermittent mild LUE numbness and tingling which quickly alleviates with "shaking his arm out". In addition to his rx ibuprofen he took Tylenol #3 last night but denies significant pain relief. He is here today for "something to get him to the appointment this week", says his dad mentioned getting a "cortisone shot" to see if it helped. No new injuries/complaints. He denies fevers, chills, neck/back pain, CP, SOB, abd pain, n/v/d/c, dysuria, hematuria, joint swelling, skin changes, persistent numbness/tingling, or weakness.   The history is provided by the patient and medical records. No language interpreter was used.  Arm Pain  This is a chronic problem. The current episode started more than 1 week ago. The problem occurs constantly. The problem has not changed since  onset.Pertinent negatives include no chest pain, no abdominal pain and no shortness of breath. The symptoms are aggravated by twisting. Nothing relieves the symptoms. He has tried acetaminophen (ibuprofen and tylenol #3) for the symptoms. The treatment provided no relief.    History reviewed. No pertinent past medical history.  Patient Active Problem List   Diagnosis Date Noted  . 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 No pertinent surgical history.     Home Medications    Prior to Admission medications   Medication Sig Start Date End Date Taking? Authorizing Provider  betamethasone dipropionate (DIPROLENE) 0.05 % ointment Apply topically 2 (two) times daily. Patient not taking: Reported on 10/24/2015 07/01/15   Audry Piliyler Mohr, PA-C  predniSONE (DELTASONE) 10 MG tablet Sig: 4 tables once a day for 3 days, 3 tablets once a day X3 days, 2 tablets a day for 3 days, 1 tablet a day for 3 days. Patient not taking: Reported on 10/24/2015 10/10/15   Tharon AquasFrank C Patrick, PA    Family History History reviewed. No pertinent family history.  Social History Social History  Substance Use Topics  . Smoking status: Current Every Day Smoker    Packs/day: 1.00    Types: Cigarettes  . Smokeless tobacco: Never Used  . Alcohol use No     Allergies   Review of patient's allergies indicates no known allergies.   Review of Systems Review of Systems  Constitutional: Negative for chills and fever.  Respiratory: Negative for shortness of breath.   Cardiovascular: Negative for  chest pain.  Gastrointestinal: Negative for abdominal pain, constipation, diarrhea, nausea and vomiting.  Genitourinary: Negative for dysuria and hematuria.  Musculoskeletal: Positive for arthralgias (L arm). Negative for joint swelling, myalgias and neck pain.  Skin: Negative for color change.  Allergic/Immunologic: Negative for immunocompromised state.  Neurological: Positive for  numbness (intermittent paresthesias in L arm). Negative for weakness.  Psychiatric/Behavioral: Negative for confusion.  A complete 10 system review of systems was obtained and all systems are negative except as noted in the HPI and PMH.    Physical Exam Updated Vital Signs BP 127/82 (BP Location: Right Arm)   Pulse 71   Temp 97.7 F (36.5 C) (Oral)   Resp 18   SpO2 100%   Physical Exam  Constitutional: He is oriented to person, place, and time. Vital signs are normal. He appears well-developed and well-nourished.  Non-toxic appearance. No distress.  Afebrile, nontoxic, NAD  HENT:  Head: Normocephalic and atraumatic.  Mouth/Throat: Mucous membranes are normal.  Eyes: Conjunctivae and EOM are normal. Right eye exhibits no discharge. Left eye exhibits no discharge.  Neck: Normal range of motion. Neck supple. Muscular tenderness present. No spinous process tenderness present. No neck rigidity. Normal range of motion present.  FROM intact without spinous process TTP, no bony stepoffs or deformities, with mild left sided trapezius and paraspinous muscle TTP and muscle spasms. No rigidity or meningeal signs. No bruising or swelling.  Cardiovascular: Normal rate and intact distal pulses.   Pulmonary/Chest: Effort normal. No respiratory distress.  Abdominal: Normal appearance. He exhibits no distension.  Musculoskeletal: Normal range of motion.  C-spine as above, left trapezius tenderness and spasm as mentioned above, FROM intact at shoulder and all joints of the LUE, no swelling or deformities, no bruising or erythema, no warmth.  MAE x4 Strength and sensation grossly intact Distal pulses intact Gait steady  Neurological: He is alert and oriented to person, place, and time. He has normal strength. No sensory deficit.  Skin: Skin is warm, dry and intact. No rash noted.  Psychiatric: He has a normal mood and affect.  Nursing note and vitals reviewed.   ED Treatments / Results  Labs (all  labs ordered are listed, but only abnormal results are displayed) Labs Reviewed - No data to display  EKG  EKG Interpretation None       Radiology No results found.   Results for orders placed or performed during the hospital encounter of 12/13/15  Basic metabolic panel  Result Value Ref Range   Sodium 138 135 - 145 mmol/L   Potassium 3.9 3.5 - 5.1 mmol/L   Chloride 109 101 - 111 mmol/L   CO2 24 22 - 32 mmol/L   Glucose, Bld 88 65 - 99 mg/dL   BUN 10 6 - 20 mg/dL   Creatinine, Ser 2.13 0.61 - 1.24 mg/dL   Calcium 9.6 8.9 - 08.6 mg/dL   GFR calc non Af Amer >60 >60 mL/min   GFR calc Af Amer >60 >60 mL/min   Anion gap 5 5 - 15  CBC  Result Value Ref Range   WBC 12.5 (H) 4.0 - 10.5 K/uL   RBC 4.96 4.22 - 5.81 MIL/uL   Hemoglobin 15.0 13.0 - 17.0 g/dL   HCT 57.8 46.9 - 62.9 %   MCV 90.3 78.0 - 100.0 fL   MCH 30.2 26.0 - 34.0 pg   MCHC 33.5 30.0 - 36.0 g/dL   RDW 52.8 41.3 - 24.4 %   Platelets 238 150 - 400  K/uL  I-stat troponin, ED  Result Value Ref Range   Troponin i, poc 0.00 0.00 - 0.08 ng/mL   Comment 3           Dg Chest 2 View  Result Date: 12/13/2015 CLINICAL DATA:  Chest pain EXAM: CHEST  2 VIEW COMPARISON:  None. FINDINGS: The heart size and mediastinal contours are within normal limits. Both lungs are clear. The visualized skeletal structures are unremarkable. IMPRESSION: No active cardiopulmonary disease. Electronically Signed   By: Marlan Palau M.D.   On: 12/13/2015 12:00   Ct Cervical Spine Wo Contrast  Result Date: 12/13/2015 CLINICAL DATA:  Numbness in the left arm for 2 days.  Low neck pain. EXAM: CT CERVICAL SPINE WITHOUT CONTRAST TECHNIQUE: Multidetector CT imaging of the cervical spine was performed without intravenous contrast. Multiplanar CT image reconstructions were also generated. COMPARISON:  None. FINDINGS: Alignment: Normal. Skull base and vertebrae: No acute fracture. No primary bone lesion or focal pathologic process. Soft tissues and spinal  canal: No prevertebral fluid or swelling. No visible canal hematoma. Disc levels: Mild degenerative disc disease with disc height loss at C4-5. Broad-based disc osteophyte complex at C4-5 with bilateral mild uncovertebral degenerative changes. No significant foraminal stenosis. Upper chest: No focal abnormality. Other: None. IMPRESSION: 1.  No acute osseous injury of the cervical spine. 2. Mild degenerative disc disease with a broad-based disc osteophyte complex and bilateral uncovertebral degenerative changes at C4-5. Electronically Signed   By: Elige Ko   On: 12/13/2015 14:41     Procedures Procedures  DIAGNOSTIC STUDIES: Oxygen Saturation is 100% on RA, normal by my interpretation.    COORDINATION OF CARE: 12:18 PM Discussed next steps with pt. Pt verbalized understanding and is agreeable with the plan.    Medications Ordered in ED Medications  dexamethasone (DECADRON) injection 10 mg (not administered)     Initial Impression / Assessment and Plan / ED Course  I have reviewed the triage vital signs and the nursing notes.  Pertinent labs & imaging results that were available during my care of the patient were reviewed by me and considered in my medical decision making (see chart for details).  Clinical Course    35 y.o. male here with ongoing L arm pain x1 month, seen last week and had CT neck that showed DDD of C4-5, likely cause of his radicular pain. Some intermittent paresthesias as well. NVI with soft compartments on exam, mild trapezius tenderness and spasm. Discussed use of heat, and will start on mobic instead of ibuprofen. Will give decadron here to help with inflammation and paresthesia symptoms, discussed tylenol PRN additional relief. Massage of area discussed. F/up with ortho at already scheduled appt this week. Doubt need for further emergent imaging at this time. I explained the diagnosis and have given explicit precautions to return to the ER including for any other new  or worsening symptoms. The patient understands and accepts the medical plan as it's been dictated and I have answered their questions. Discharge instructions concerning home care and prescriptions have been given. The patient is STABLE and is discharged to home in good condition.   I personally performed the services described in this documentation, which was scribed in my presence. The recorded information has been reviewed and is accurate.  Final Clinical Impressions(s) / ED Diagnoses   Final diagnoses:  Left arm pain  Cervical radiculopathy  DDD (degenerative disc disease), cervical    New Prescriptions New Prescriptions   MELOXICAM (MOBIC) 15 MG TABLET  Take 1 tablet (15 mg total) by mouth daily. TAKE WITH MEALS     Clemie General Camprubi-Soms, PA-C 12/20/15 1239    Donnetta Hutching, MD 12/21/15 1755

## 2016-01-02 ENCOUNTER — Emergency Department (HOSPITAL_COMMUNITY)
Admission: EM | Admit: 2016-01-02 | Discharge: 2016-01-02 | Disposition: A | Discharge status: Home / Self Care | Payer: Medicaid Other | Attending: Emergency Medicine | Admitting: Emergency Medicine

## 2016-01-02 DIAGNOSIS — M5410 Radiculopathy, site unspecified: Secondary | ICD-10-CM | POA: Insufficient documentation

## 2016-01-02 DIAGNOSIS — F17210 Nicotine dependence, cigarettes, uncomplicated: Secondary | ICD-10-CM | POA: Insufficient documentation

## 2016-01-02 DIAGNOSIS — M79602 Pain in left arm: Secondary | ICD-10-CM | POA: Diagnosis present

## 2016-01-02 DIAGNOSIS — Z79899 Other long term (current) drug therapy: Secondary | ICD-10-CM | POA: Insufficient documentation

## 2016-01-02 MED ORDER — NAPROXEN 500 MG PO TABS
500.0000 mg | ORAL_TABLET | Freq: Two times a day (BID) | ORAL | 0 refills | Status: DC
Start: 2016-01-02 — End: 2017-05-23

## 2016-01-02 MED ORDER — KETOROLAC TROMETHAMINE 60 MG/2ML IM SOLN
60.0000 mg | Freq: Once | INTRAMUSCULAR | Status: AC
  Administered 2016-01-02: 60 mg via INTRAMUSCULAR
  Filled 2016-01-02: qty 2

## 2016-01-02 MED ORDER — METHOCARBAMOL 500 MG PO TABS: 500.0000 mg | ORAL_TABLET | Freq: Two times a day (BID) | ORAL | 0 refills | Status: DC

## 2016-01-02 NOTE — Discharge Instructions (Signed)
1. Medications: naprosyn, robaxin, usual home medications 2. Treatment: rest, drink plenty of fluids,  3. Follow Up: Please followup with your orthopedist at your appointment this week for discussion of your diagnoses and further evaluation after today's visit; Please return to the ER for weakness or other concerns

## 2016-01-02 NOTE — ED Provider Notes (Signed)
[facility] Provider Note   CSN: [id number] Arrival date & time: 01/02/16  0536     History   Chief Complaint No chief complaint on file.   HPI Chase Hawkins is a 35 y.o. male with a hx of Degenerative disc disease presents to the Emergency Department complaining of gradual, persistent, progressively worsening left upper back and left arm pain onset approximately 6 weeks ago. Patient reports pain is 1010, aching and throbbing with associated pain in the left shoulder region. He states that it sometimes radiates from the shoulder down his left arm. Patient was seen on 12/13/2015 for left arm paresthesias and pain. At that visit he had a CT scan performed which showed degenerative disc disease and osteophytes at the C4-5 region. He was discharged with ibuprofen. He was seen again on 12/20/2015 for persistent pain. He was given Decadron shot and mobility. He followed up with his orthopedist on 12/23/2015 and was given a steroid taper. He has not been taking the ibuprofen. He reports this has not consistently helped his pain. He reports that he presents today because the pain kept him up all night. He reports the tightness in his shoulder symptoms feels like muscle spasm.  He had no weakness in the left upper extremity.  Patient reports no new injury. He states he is simply here for additional pain control.  The history is provided by the patient and medical records. No language interpreter was used.    No past medical history on file.  Patient Active Problem List   Diagnosis Date Noted  . 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 Hammer toe 10/24/2015    No past surgical history on file.     Home Medications    Prior to Admission medications   Medication Sig Start Date End Date Taking? Authorizing Provider  betamethasone dipropionate (DIPROLENE) 0.05 % ointment Apply topically 2 (two) times daily. Patient not taking: Reported on 10/24/2015 07/01/15   Audry Piliyler Mohr, PA-C    meloxicam (MOBIC) 15 MG tablet Take 1 tablet (15 mg total) by mouth daily. TAKE WITH MEALS 12/20/15   Mercedes Camprubi-Soms, PA-C  methocarbamol (ROBAXIN) 500 MG tablet Take 1 tablet (500 mg total) by mouth 2 (two) times daily. 01/02/16   Pailyn Bellevue, PA-C  naproxen (NAPROSYN) 500 MG tablet Take 1 tablet (500 mg total) by mouth 2 (two) times daily with a meal. 01/02/16   Ronda Rajkumar, PA-C  predniSONE (DELTASONE) 10 MG tablet Sig: 4 tables once a day for 3 days, 3 tablets once a day X3 days, 2 tablets a day for 3 days, 1 tablet a day for 3 days. Patient not taking: Reported on 10/24/2015 10/10/15   Tharon AquasFrank C Patrick, PA    Family History No family history on file.  Social History Social History  Substance Use Topics  . Smoking status: Current Every Day Smoker    Packs/day: 1.00    Types: Cigarettes  . Smokeless tobacco: Never Used  . Alcohol use No     Allergies   Review of patient's allergies indicates no known allergies.   Review of Systems Review of Systems  Musculoskeletal: Positive for arthralgias (left arm).  Neurological: Positive for numbness ( intermittent paresthesias).  All other systems reviewed and are negative.    Physical Exam Updated Vital Signs BP 126/89 (BP Location: Right Arm)   Pulse 71   Temp 97.5 F (36.4 C) (Oral)   Resp 18   Ht 6\' 1"  (1.854 m)   Wt 87.1 kg  SpO2 97%   BMI 25.33 kg/m   Physical Exam  Constitutional: He appears well-developed and well-nourished. No distress.  HENT:  Head: Normocephalic and atraumatic.  Eyes: Conjunctivae are normal.  Neck: Normal range of motion.  Cardiovascular: Normal rate, regular rhythm and intact distal pulses.   Pulses:      Radial pulses are 2+ on the right side, and 2+ on the left side.  Capillary refill < 3 sec  Pulmonary/Chest: Effort normal and breath sounds normal.  Musculoskeletal: He exhibits tenderness. He exhibits no edema.  Left shoulder: Full range of motion without  significant pain. No deformities noted. No joint line tenderness.  Sensation intact. Strength 5/5 in the bilateral upper extremities.  C-spine and T-spine with full range of motion. No midline tenderness, step-offs or deformities. Tenderness to palpation along the left trapezius and paraspinal muscle of the lower C-spine and upper T-spine.  Neurological: He is alert. Coordination normal.  Normal gait  Skin: Skin is warm and dry. He is not diaphoretic.  No tenting of the skin  Psychiatric: He has a normal mood and affect.  Nursing note and vitals reviewed.    ED Treatments / Results   Procedures Procedures (including critical care time)  Medications Ordered in ED Medications  ketorolac (TORADOL) injection 60 mg (60 mg Intramuscular Given 01/02/16 0601)     Initial Impression / Assessment and Plan / ED Course  I have reviewed the triage vital signs and the nursing notes.  Pertinent labs & imaging results that were available during my care of the patient were reviewed by me and considered in my medical decision making (see chart for details).  Clinical Course    Pt presents with nontraumatic and neck, upper back, left shoulder and left arm pain. Previous imaging shows degenerative disc disease. Patient reports he has been compliant with his medications of following with orthopedics as directed. He simply wants increased pain control.  He has not been taking his ibuprofen or opaque with the steroids. Will add naproxen and Robaxin as he has not had a trial of muscle relaxers.  Radicular pain is reproducible. Normal strength in the upper extremities. Patient is to follow with orthopedics on 01/06/2016 at his follow-up appointment.  No indication for additional imaging today. He is afebrile and there is no indication of septic joint.  Final Clinical Impressions(s) / ED Diagnoses   Final diagnoses:  Left arm pain  Radicular pain    New Prescriptions New Prescriptions   METHOCARBAMOL  (ROBAXIN) 500 MG TABLET    Take 1 tablet (500 mg total) by mouth 2 (two) times daily.   NAPROXEN (NAPROSYN) 500 MG TABLET    Take 1 tablet (500 mg total) by mouth 2 (two) times daily with a meal.     Dierdre Forth, PA-C 01/02/16 1610    Dione Booze, MD 01/02/16 [phone]

## 2016-01-02 NOTE — ED Notes (Signed)
Pt understood dc material. NAD noted. Scripts given at dc 

## 2016-01-26 ENCOUNTER — Encounter: Payer: Self-pay | Admitting: Physical Therapy

## 2016-01-26 ENCOUNTER — Ambulatory Visit: Payer: Medicaid Other | Attending: Sports Medicine | Admitting: Physical Therapy

## 2016-01-26 DIAGNOSIS — M542 Cervicalgia: Secondary | ICD-10-CM | POA: Diagnosis present

## 2016-01-26 DIAGNOSIS — R252 Cramp and spasm: Secondary | ICD-10-CM | POA: Insufficient documentation

## 2016-01-26 NOTE — Therapy (Signed)
[facility] [address] [place], [place], [address] Phone: [phone]   Fax:  [phone]  Physical Therapy Treatment  Patient Details  Name: Chase Hawkins MRN: [id number] Date of Birth: 1980-07-12 Referring Provider: Penni BombardKendall  Encounter Date: 01/26/2016      PT End of Session - 01/26/16 1434    Visit Number 1   Authorization Type Medicaid   PT Start Time 1400   PT Stop Time 1450   PT Time Calculation (min) 50 min   Activity Tolerance Patient tolerated treatment well   Behavior During Therapy [facility] for tasks assessed/performed      History reviewed. No pertinent past medical history.  History reviewed. No pertinent surgical history.  There were no vitals filed for this visit.      Subjective Assessment - 01/26/16 1411    Subjective Patient reports that he has been having pain for about 2-3 months, he was having pain down the left arm.  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 Yes   Pain Score 6    Pain Location Neck   Pain Orientation Left;Posterior   Pain Descriptors / Indicators Aching   Pain Type Acute pain   Pain Radiating Towards left upper trap and left upper arm    Pain Onset More than a month ago   Pain Frequency Constant   Aggravating Factors  As the day goes on pain is a little worse pain up to 9/10    Pain Relieving Factors easy motions, massage helps , pain meds at best a 2/10   Effect of Pain on Daily Activities some difficulty with motions and ADL's, just hurt            [facility] Assessment - 01/26/16 0001      Assessment   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  No   Is the patient reluctant to leave their home because of a fear of falling?  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     Prior Function   Level of Independence Independent   Vocation Full time employment   Chief Executive OfficerVocation Requirements lawncare, lifting, pushing and pulling   Leisure was going to the gym some     Posture/Postural Control   Posture Comments fwd head and rounded shoulders, slouched posture     ROM / Strength   AROM / PROM / Strength AROM;Strength     AROM   Overall AROM Comments Cervcial ROM was decreased 25% with pain for end ranges of all motions, shoulder ROM was WFL's with tightness and some limitations in to IR     Strength   Overall Strength Comments left shoulder 4/5, right shoulder 5/5     Palpation   Palpation comment tight with spasms and tenderness in the left upper trap and rhomboid area                     [facility] Adult PT Treatment/Exercise -  01/26/16 0001      Modalities   Modalities Traction     Traction   Type of Traction Cervical   Min (lbs) 16   Hold Time static   Time 15                PT Education - 01/26/16 1433    Education provided Yes   Education Details Went over posture and body mechanics, went over home cervical traction unit, went over exercises at gym to avoid   Person(s) Educated Patient   Methods Explanation   Comprehension Verbalized understanding             PT Long Term Goals - 01/26/16 1436      PT LONG TERM GOAL #1   Title understand posture and body mechanics   Time 1   Period Days   Status Achieved     PT LONG TERM GOAL #2   Title understand home cervical traction   Time 1   Period Days   Status Achieved               Plan - 01/26/16 1435    Clinical Impression Statement Patient iwth neck pain and left UE radicular symptoms over the past 3 months, 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  He has lost some ROM and  has significant spasms in the left upper trap and rhomboid area   Rehab Potential Good   PT Frequency One time visit   PT Treatment/Interventions Traction;Therapeutic exercise;Patient/family education   PT Next Visit Plan one time visit due to Medicaid   Consulted and Agree with Plan of Care Patient      Patient will benefit from skilled therapeutic intervention in order to improve the following deficits and impairments:  Pain, Decreased range of motion, Increased muscle spasms  Visit Diagnosis: Cervicalgia - Plan: PT plan of care cert/re-cert  Cramp and spasm - Plan: PT plan of care cert/re-cert     Problem List Patient Active Problem List   Diagnosis Date Noted  . 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 Hammer toe 10/24/2015    [name], PT 01/26/2016, 2:44 PM  [facility] [address] [place], [place], [address] Phone: [phone]   Fax:  [phone]  Name: Chase Hawkins MRN: [id number] Date of Birth: [birth year]

## 2016-02-24 ENCOUNTER — Telehealth: Payer: Self-pay | Admitting: *Deleted

## 2016-02-24 ENCOUNTER — Encounter: Payer: Self-pay | Admitting: Podiatry

## 2016-02-24 ENCOUNTER — Ambulatory Visit (INDEPENDENT_AMBULATORY_CARE_PROVIDER_SITE_OTHER): Payer: Medicaid Other | Admitting: Podiatry

## 2016-02-24 DIAGNOSIS — M2010 Hallux valgus (acquired), unspecified foot: Secondary | ICD-10-CM | POA: Diagnosis not present

## 2016-02-24 DIAGNOSIS — M2040 Other hammer toe(s) (acquired), unspecified foot: Secondary | ICD-10-CM | POA: Diagnosis not present

## 2016-02-24 DIAGNOSIS — M2450 Contracture, unspecified joint: Secondary | ICD-10-CM

## 2016-02-24 NOTE — Telephone Encounter (Signed)
"  They told me to call you about my surgery I am having on January 3."  You want to schedule surgery for January 3?  "Yes, I may be jumping the gun."  I will get you scheduled.  You will need to register with the surgical center.  Instructions are in the brochure that was given to you in your bag.  Someone from the surgical center will call you a day or two prior to with the arrival time.

## 2016-02-24 NOTE — Patient Instructions (Signed)

## 2016-03-05 NOTE — Progress Notes (Signed)
Subjective: 35 year old male presents the office today for surgical consultation due to painful bunion on the right foot as well as hammertoes in the toes overlapping his big toe. He states he continues have pressure and pain particularly with shoe gear. He is attended multiple conservative treatment including offloading, padding, shoe gear changes without any relief symptoms at this point he is requesting surgical intervention. Denies any systemic complaints such as fevers, chills, nausea, vomiting. No acute changes since last appointment, and no other complaints at this time.   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 On the right foot the second digit is turned overlapping the hallux causing irritation between his toes. He also has a callus in between his second third toes of the PIPJ. There is mild flexion contracture the second third toes as well. There is hypermobility of the first metatarsal cuneiforms joint on the right foot. There is mild irritation of the medial aspect of the first metatarsal head from irritation shoe gear. Tenderness along the bunion site. No other areas of tenderness. No open lesions or pre-ulcerative lesions.  No pain with calf compression, swelling, warmth, erythema  Assessment: Significant HAV with hyperkeratotic lesions, hammertoes/overriding digits right foot  Plan: -All treatment options discussed with the patient including all alternatives, risks, complications.  -X-rays were again reviewed with the patient. I discussed with him both conservative and surgical treatment options. He'll this time he wishes to pursue a surgical intervention. Discussed with him treatment options spur surgery in regards to Lapidus bunionectomy as well as hammertoe repair and metatarsal phalangeal joint release. I discussed with the surgery as well as the postoperative  course. Discussed with him other surgical options however he wishes to proceed with the first. -The incision placement as well as the postoperative course was discussed with the patient. I discussed risks of the surgery which include, but not limited to, infection, bleeding, pain, swelling, need for further surgery, delayed or nonhealing, painful or ugly scar, numbness or sensation changes, over/under correction, recurrence, transfer lesions, further deformity, hardware failure, DVT/PE, loss of toe/foot. Patient understands these risks and wishes to proceed with surgery. The surgical consent was reviewed with the patient all 3 pages were signed. No promises or guarantees were given to the outcome of the procedure. All questions were answered to the best of my ability. Before the surgery the patient was encouraged to call the office if there is any further questions. [facility] an outpatient basis. -Patient encouraged to call the office with any questions, concerns, change in symptoms.   Ovid CurdMatthew Keidy Thurgood, DPM

## 2016-03-12 ENCOUNTER — Telehealth: Payer: Self-pay | Admitting: *Deleted

## 2016-03-12 NOTE — Telephone Encounter (Signed)
"  I talked to you a couple of weeks ago.  I scheduled surgery for January 4th.  You said you didn't have everything yet and asked me to call back to confirm."  I have you scheduled.

## 2016-04-04 ENCOUNTER — Telehealth: Payer: Self-pay | Admitting: Podiatry

## 2016-04-04 NOTE — Telephone Encounter (Signed)
Patient called saying he wants to cancel his upcoming surgery with Dr. Ardelle AntonWagoner.

## 2016-04-05 NOTE — Telephone Encounter (Signed)
I received a message that you wanted to cancel your surgery.  "Yes, I have some family issues going on that I have to take care of.  I am not sure when I will be able to reschedule it."  Okay, just give us a call when you need us.    [facility].

## 2017-04-15 ENCOUNTER — Encounter: Payer: Self-pay | Admitting: Gastroenterology

## 2017-05-11 IMAGING — CT CT CERVICAL SPINE W/O CM
3 of 4 series · 12 of 33 positions shown, 14 images · non-contrast
Comparison: None.

CLINICAL DATA: Numbness in the left arm for 2 days.  Low neck pain.

EXAM:
CT CERVICAL SPINE WITHOUT CONTRAST
TECHNIQUE: Multidetector CT imaging of the cervical spine was performed without
intravenous contrast. Multiplanar CT image reconstructions were also
generated.

[Series 3: c_spine 2.0 i30s 3 · axial · 0.24mm/px · z∈[-234,-114]mm · 4 of 92 slices shown, 5 images]
[im 16/92  soft-tissue]
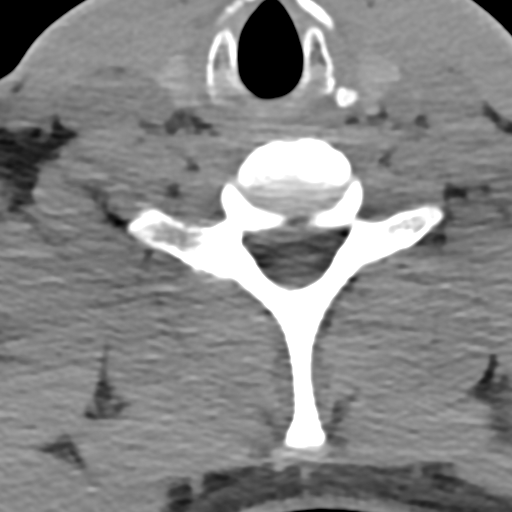
[im 16/92  bone]
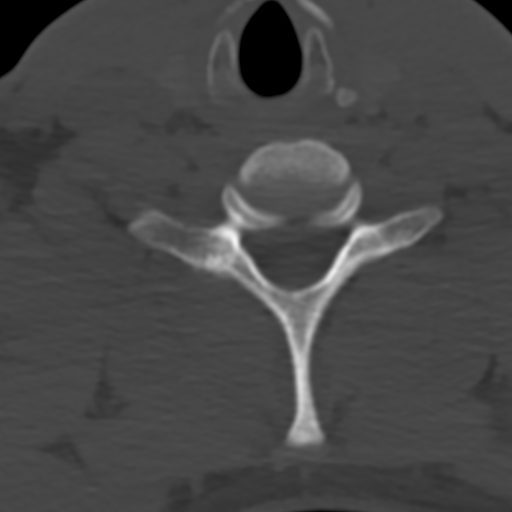
[im 31/92  bone]
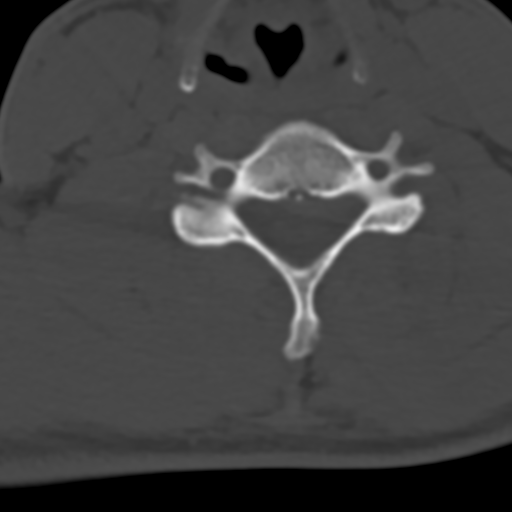
[im 61/92  bone]
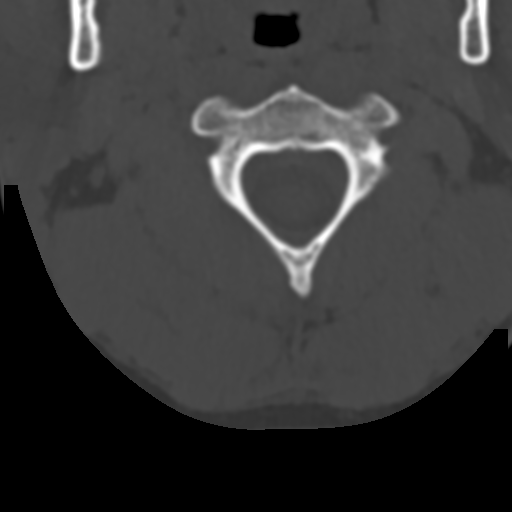
[im 76/92  bone]
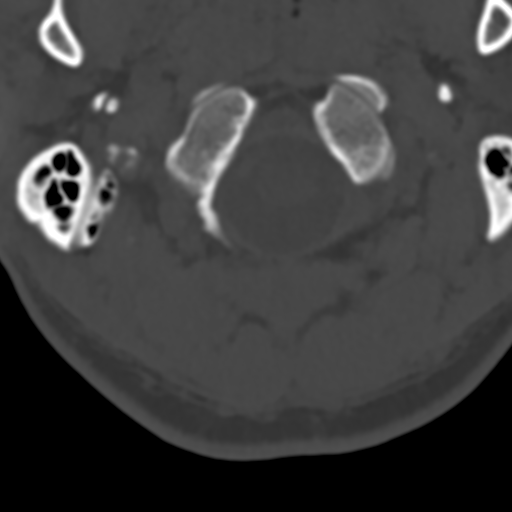

[Series 5: sagittal bone · sagittal · 0.24mm/px · 5 of 61 slices shown, 6 images]
[im 21/61  bone]
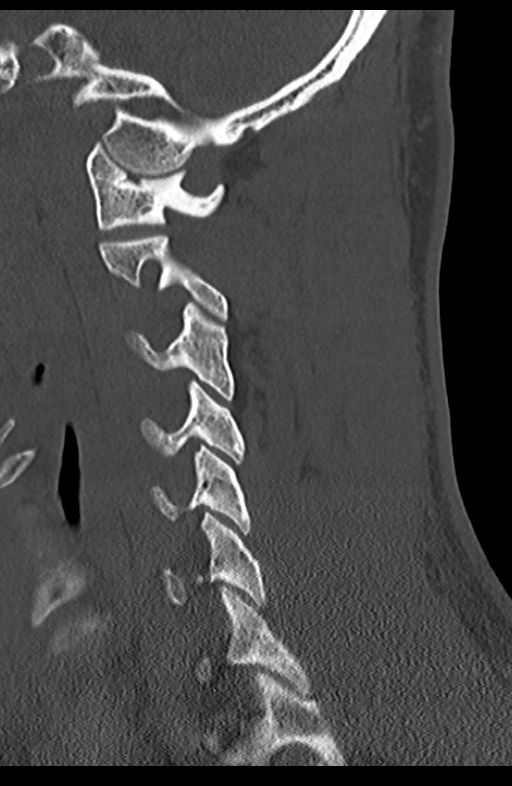
[im 26/61  bone]
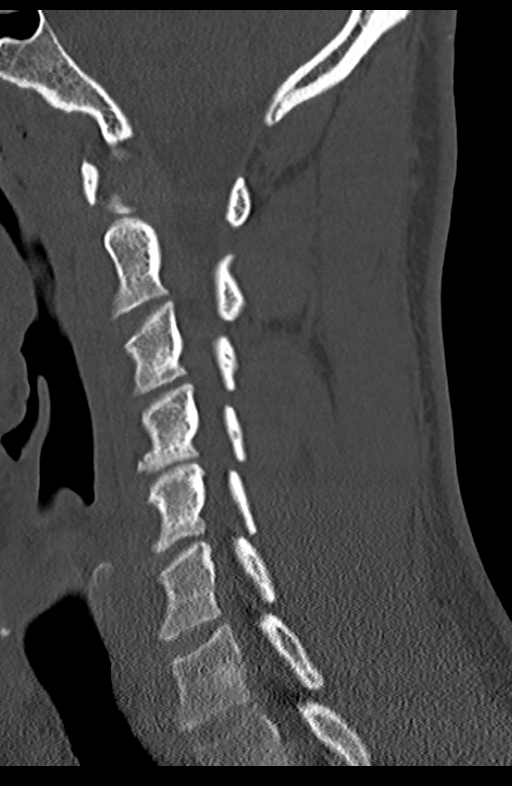
[im 31/61  soft-tissue]
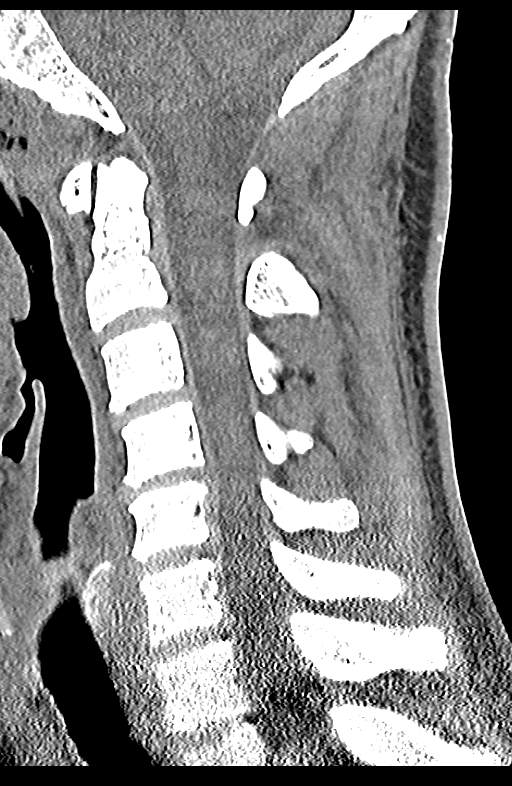
[im 31/61  bone]
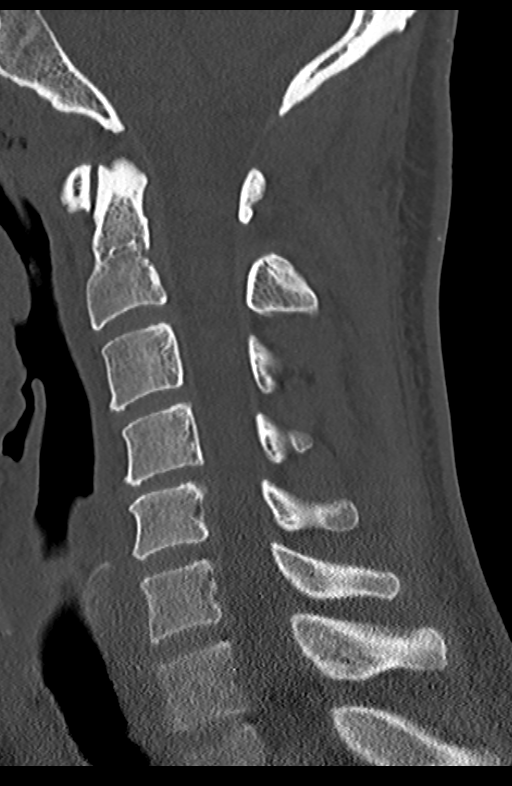
[im 36/61  bone]
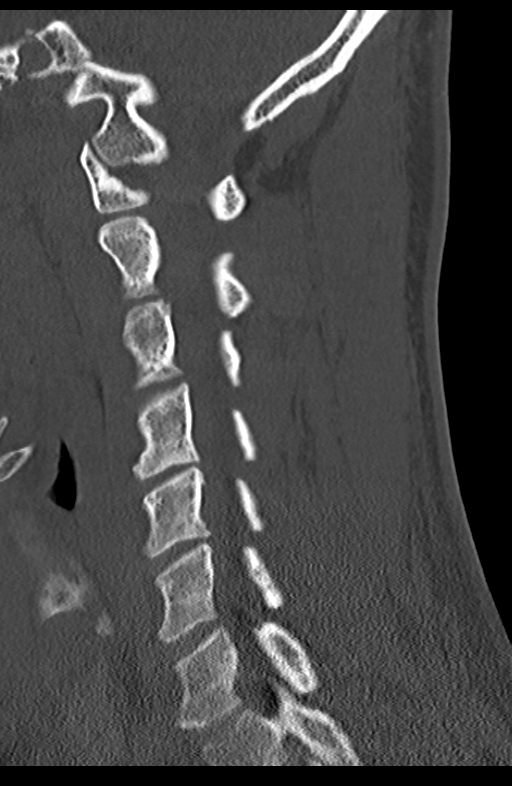
[im 41/61  bone]
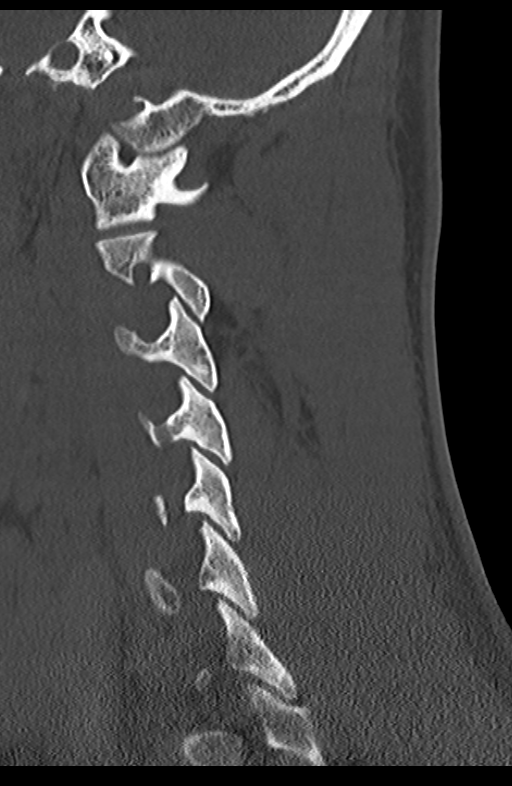

[Series 9: coronal bone · coronal · 0.22mm/px · 3 of 61 slices shown]
[im 13/61  bone]
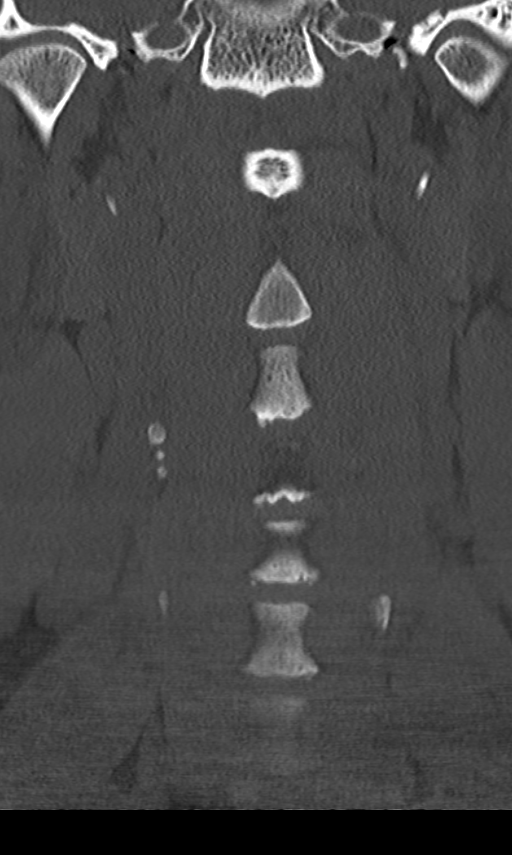
[im 25/61  bone]
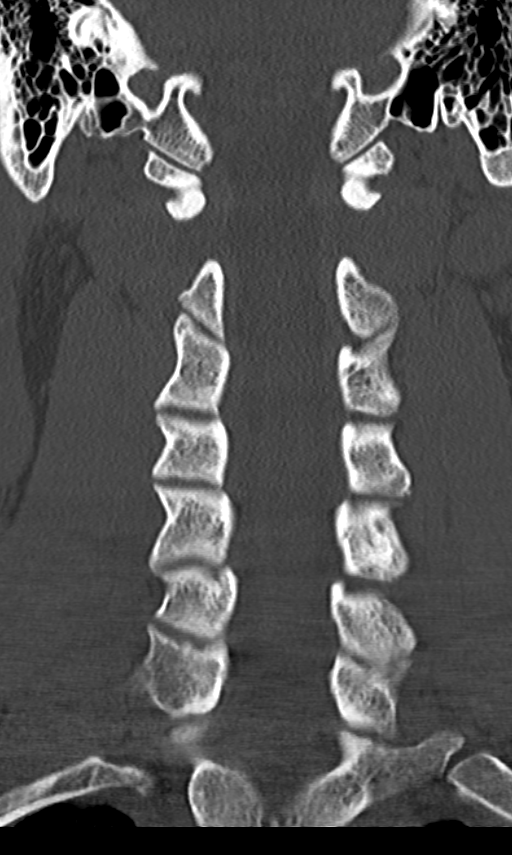
[im 37/61  bone]
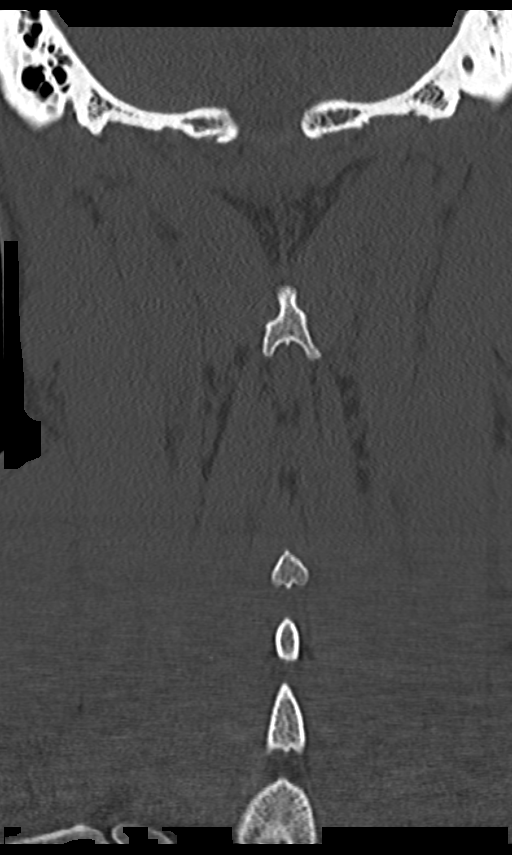

[12 of 33 positions shown; findings below may reference images not displayed]

FINDINGS: Alignment: Normal.

Skull base and vertebrae: No acute fracture. No primary bone lesion
or focal pathologic process.

Soft tissues and spinal canal: No prevertebral fluid or swelling. No
visible canal hematoma.

Disc levels: Mild degenerative disc disease with disc height loss at
C4-5. Broad-based disc osteophyte complex at C4-5 with bilateral
mild uncovertebral degenerative changes. No significant foraminal
stenosis.

Upper chest: No focal abnormality.

Other: None.
IMPRESSION: 1.  No acute osseous injury of the cervical spine.
2. Mild degenerative disc disease with a broad-based disc osteophyte
complex and bilateral uncovertebral degenerative changes at C4-5.

## 2017-05-21 ENCOUNTER — Encounter: Payer: Self-pay | Admitting: *Deleted

## 2017-05-23 ENCOUNTER — Encounter: Payer: Self-pay | Admitting: Gastroenterology

## 2017-05-23 ENCOUNTER — Ambulatory Visit: Payer: Medicaid Other | Admitting: Gastroenterology

## 2017-05-23 ENCOUNTER — Other Ambulatory Visit (INDEPENDENT_AMBULATORY_CARE_PROVIDER_SITE_OTHER): Payer: Medicaid Other

## 2017-05-23 VITALS — BP 120/70 | HR 64 | Ht 72.0 in | Wt 202.2 lb

## 2017-05-23 DIAGNOSIS — K625 Hemorrhage of anus and rectum: Secondary | ICD-10-CM

## 2017-05-23 DIAGNOSIS — K648 Other hemorrhoids: Secondary | ICD-10-CM | POA: Diagnosis not present

## 2017-05-23 LAB — CBC WITH DIFFERENTIAL/PLATELET
BASOS ABS: 0 10*3/uL (ref 0.0–0.1)
BASOS PCT: 0.8 % (ref 0.0–3.0)
EOS ABS: 0 10*3/uL (ref 0.0–0.7)
Eosinophils Relative: 0.7 % (ref 0.0–5.0)
HEMATOCRIT: 46.2 % (ref 39.0–52.0)
HEMOGLOBIN: 15.7 g/dL (ref 13.0–17.0)
LYMPHS PCT: 49.1 % — AB (ref 12.0–46.0)
Lymphs Abs: 1.8 10*3/uL (ref 0.7–4.0)
MCHC: 34 g/dL (ref 30.0–36.0)
MCV: 92.9 fl (ref 78.0–100.0)
Monocytes Absolute: 0.3 10*3/uL (ref 0.1–1.0)
Monocytes Relative: 7.1 % (ref 3.0–12.0)
Neutro Abs: 1.6 10*3/uL (ref 1.4–7.7)
Neutrophils Relative %: 42.3 % — ABNORMAL LOW (ref 43.0–77.0)
Platelets: 269 10*3/uL (ref 150.0–400.0)
RBC: 4.98 Mil/uL (ref 4.22–5.81)
RDW: 14.1 % (ref 11.5–15.5)
WBC: 3.7 10*3/uL — AB (ref 4.0–10.5)

## 2017-05-23 LAB — BASIC METABOLIC PANEL
BUN: 11 mg/dL (ref 6–23)
CHLORIDE: 105 meq/L (ref 96–112)
CO2: 30 mEq/L (ref 19–32)
CREATININE: 1.14 mg/dL (ref 0.40–1.50)
Calcium: 9.3 mg/dL (ref 8.4–10.5)
GFR: 92.74 mL/min (ref >60.00)
Glucose, Bld: 91 mg/dL (ref 70–99)
Potassium: 4.9 mEq/L (ref 3.5–5.1)
Sodium: 139 mEq/L (ref 135–145)

## 2017-05-23 MED ORDER — HYDROCORTISONE ACETATE 25 MG RE SUPP: 25.0000 mg | Freq: Every day | RECTAL | 1 refills | Status: AC

## 2017-05-23 MED ORDER — NA SULFATE-K SULFATE-MG SULF 17.5-3.13-1.6 GM/177ML PO SOLN: 1.0000 | Freq: Once | ORAL | 0 refills | Status: AC

## 2017-05-23 NOTE — Patient Instructions (Addendum)
You have been scheduled for a colonoscopy. Please follow written instructions given to you at your visit today.  Please pick up your prep supplies at the pharmacy within the next 1-3 days. If you use inhalers (even only as needed), please bring them with you on the day of your procedure. Your physician has requested that you go to www.startemmi.com and enter the access code given to you at your visit today. This web site gives a general overview about your procedure. However, you should still follow specific instructions given to you by our office regarding your preparation for the procedure.  Go to the basement for labs today   We have sent Anusol Suppositories to your pharmacy, if these are not covered by your insurance you can use Over the counter Preparation H suppositories at bedtime  Take Benefiber 1 tablespoon three times a day with meals    If you are age 38 or older, your body mass index should be between 23-30. Your Body mass index is 27.43 kg/m. If this is out of the aforementioned range listed, please consider follow up with your Primary Care Provider.  If you are age 38 or younger, your body mass index should be between 19-25. Your Body mass index is 27.43 kg/m. If this is out of the aformentioned range listed, please consider follow up with your Primary Care Provider.

## 2017-05-23 NOTE — Progress Notes (Signed)
Chase Hawkins    [id number]    [birth year]  Primary Care Physician:System, Provider Not In  Referring Physician: No referring provider defined for this encounter.  Chief complaint: Rectal bleeding, symptomatic hemorrhoids  HPI: 37 year old male here with complaints of intermittent bright red blood per rectum mostly when he wipes.  He noticed hemorrhoids since age 37, initially he would have tissue from hemorrhoids only when he is straining excessively with constipation but in the past few years he feels that hemorrhoids protrude with no straining about once or twice a week.  He notices blood per rectum almost daily with most bowel movements.  He has a regular 1-2 bowel movements daily.  Denies any constipation.  He is recovering alcoholic.  He used to drink excessively since age 37 until 4 years ago.  He was having significant rectal bleeding when he was drinking heavy.  Denies any melena, abdominal pain, nausea, vomiting or weight loss. No history of NSAIDs or over-the-counter supplements No family history of colon cancer or IBD    Outpatient Encounter Medications as of 05/23/2017  Medication Sig  . predniSONE (DELTASONE) 10 MG tablet Sig: 4 tables once a day for 3 days, 3 tablets once a day X3 days, 2 tablets a day for 3 days, 1 tablet a day for 3 days. (Patient not taking: Reported on 10/24/2015)  . [DISCONTINUED] betamethasone dipropionate (DIPROLENE) 0.05 % ointment Apply topically 2 (two) times daily. (Patient not taking: Reported on 10/24/2015)  . [DISCONTINUED] meloxicam (MOBIC) 15 MG tablet Take 1 tablet (15 mg total) by mouth daily. TAKE WITH MEALS  . [DISCONTINUED] methocarbamol (ROBAXIN) 500 MG tablet Take 1 tablet (500 mg total) by mouth 2 (two) times daily.  . [DISCONTINUED] naproxen (NAPROSYN) 500 MG tablet Take 1 tablet (500 mg total) by mouth 2 (two) times daily with a meal.   No facility-administered encounter medications on file as of 05/23/2017.      Allergies as of 05/23/2017  . (No Known Allergies)    Past Medical History:  Diagnosis Date  . Hemorrhoids     No past surgical history on file.  No family history on file.  Social History   Socioeconomic History  . Marital status: Single    Spouse name: Not on file  . Number of children: Not on file  . Years of education: Not on file  . Highest education level: Not on file  Social Needs  . Financial resource strain: Not on file  . Food insecurity - worry: Not on file  . Food insecurity - inability: Not on file  . Transportation needs - medical: Not on file  . Transportation needs - non-medical: Not on file  Occupational History  . Not on file  Tobacco Use  . Smoking status: Current Every Day Smoker    Packs/day: 1.00    Types: Cigarettes  . Smokeless tobacco: Never Used  Substance and Sexual Activity  . Alcohol use: No  . Drug use: No  . Sexual activity: Not on file  Other Topics Concern  . Not on file  Social History Narrative  . Not on file      Review of systems: Review of Systems  Constitutional: Negative for fever and chills.  HENT: Negative.   Eyes: Negative for blurred vision.  Respiratory: Negative for cough, shortness of breath and wheezing.   Cardiovascular: Negative for chest pain and palpitations.  Gastrointestinal: as per HPI Genitourinary: Negative for dysuria, urgency,  frequency and hematuria.  Musculoskeletal: Negative for myalgias, back pain and joint pain.  Skin: Negative for itching and rash.  Neurological: Negative for dizziness, tremors, focal weakness, seizures and loss of consciousness.  Endo/Heme/Allergies: Positive for seasonal allergies.  Psychiatric/Behavioral: Negative for depression, suicidal ideas and hallucinations.  All other systems reviewed and are negative.   Physical Exam: Vitals:   05/23/17 0850  BP: 120/70  Pulse: 64   Body mass index is 27.43 kg/m. Gen:      No acute distress HEENT:  EOMI, sclera  anicteric Neck:     No masses; no thyromegaly Lungs:    Clear to auscultation bilaterally; normal respiratory effort CV:         Regular rate and rhythm; no murmurs Abd:      + bowel sounds; soft, non-tender; no palpable masses, no distension Ext:    No edema; adequate peripheral perfusion Skin:      Warm and dry; no rash Neuro: alert and oriented x 3 Psych: normal mood and affect Rectal exam: Normal anal sphincter tone, no anal fissure or external hemorrhoids Anoscopy: Small internal hemorrhoids, no active bleeding, normal dentate line, no visible nodules   Data Reviewed:  Reviewed labs, radiology imaging, old records and pertinent past GI work up   Assessment and Plan/Recommendations:  37 year old male with past history of alcohol abuse for greater than 12 years, abstinent the past 4 years here with complaints of rectal bleeding and symptomatic hemorrhoids Given history of daily per rectum, will check CBC and CMP to exclude significant anemia or liver disease Benefiber 1 tablespoon 3 times daily with meals Anusol suppository per rectum daily at bedtime for 7 days Schedule for colonoscopy to exclude neoplastic lesion or proctitis The risks and benefits as well as alternatives of endoscopic procedure(s) have been discussed and reviewed. All questions answered. The patient agrees to proceed.    Iona Beard , MD [phone] Mon-Fri 8a-5p [phone] after 5p, weekends, holidays  CC: No ref. provider found

## 2017-06-05 ENCOUNTER — Ambulatory Visit (AMBULATORY_SURGERY_CENTER): Payer: Medicaid Other | Admitting: Gastroenterology

## 2017-06-05 ENCOUNTER — Encounter: Payer: Self-pay | Admitting: Gastroenterology

## 2017-06-05 ENCOUNTER — Other Ambulatory Visit: Payer: Self-pay

## 2017-06-05 VITALS — BP 115/62 | HR 53 | Temp 96.8°F | Resp 12 | Ht 72.0 in | Wt 202.0 lb

## 2017-06-05 DIAGNOSIS — K649 Unspecified hemorrhoids: Secondary | ICD-10-CM | POA: Diagnosis not present

## 2017-06-05 DIAGNOSIS — K5520 Angiodysplasia of colon without hemorrhage: Secondary | ICD-10-CM | POA: Diagnosis not present

## 2017-06-05 DIAGNOSIS — K625 Hemorrhage of anus and rectum: Secondary | ICD-10-CM

## 2017-06-05 MED ORDER — SODIUM CHLORIDE 0.9 % IV SOLN: 500.0000 mL | Freq: Once | INTRAVENOUS | Status: AC

## 2017-06-05 NOTE — Patient Instructions (Signed)
Impression/Recommendations:  Hemorrhoid handout given to patient.  Resume previous diet. Continue present medications.  Repeat colonoscopy at age 37 for screening purposes.  YOU HAD AN ENDOSCOPIC PROCEDURE TODAY AT THE [facility]:   Refer to the procedure report that was given to you for any specific questions about what was found during the examination.  If the procedure report does not answer your questions, please call your gastroenterologist to clarify.  If you requested that your care partner not be given the details of your procedure findings, then the procedure report has been included in a sealed envelope for you to review at your convenience later.  YOU SHOULD EXPECT: Some feelings of bloating in the abdomen. Passage of more gas than usual.  Walking can help get rid of the air that was put into your GI tract during the procedure and reduce the bloating. If you had a lower endoscopy (such as a colonoscopy or flexible sigmoidoscopy) you may notice spotting of blood in your stool or on the toilet paper. If you underwent a bowel prep for your procedure, you may not have a normal bowel movement for a few days.  Please Note:  You might notice some irritation and congestion in your nose or some drainage.  This is from the oxygen used during your procedure.  There is no need for concern and it should clear up in a day or so.  SYMPTOMS TO REPORT IMMEDIATELY:   Following lower endoscopy (colonoscopy or flexible sigmoidoscopy):  Excessive amounts of blood in the stool  Significant tenderness or worsening of abdominal pains  Swelling of the abdomen that is new, acute  Fever of 100F or higher For urgent or emergent issues, a gastroenterologist can be reached at any hour by calling [phone].   DIET:  We do recommend a small meal at first, but then you may proceed to your regular diet.  Drink plenty of fluids but you should avoid alcoholic beverages for 24 hours.  ACTIVITY:   You should plan to take it easy for the rest of today and you should NOT DRIVE or use heavy machinery until tomorrow (because of the sedation medicines used during the test).    FOLLOW UP: Our staff will call the number listed on your records the next business day following your procedure to check on you and address any questions or concerns that you may have regarding the information given to you following your procedure. If we do not reach you, we will leave a message.  However, if you are feeling well and you are not experiencing any problems, there is no need to return our call.  We will assume that you have returned to your regular daily activities without incident.  If any biopsies were taken you will be contacted by phone or by letter within the next 1-3 weeks.  Please call us at [phone] if you have not heard about the biopsies in 3 weeks.    SIGNATURES/CONFIDENTIALITY: You and/or your care partner have signed paperwork which will be entered into your electronic medical record.  These signatures attest to the fact that that the information above on your After Visit Summary has been reviewed and is understood.  Full responsibility of the confidentiality of this discharge information lies with you and/or your care-partner.

## 2017-06-05 NOTE — Progress Notes (Signed)
To recovery, report to RN, VSS. 

## 2017-06-05 NOTE — Op Note (Signed)
[facility] Patient Name: Chase Hawkins Procedure Date: 06/05/2017 9:04 AM MRN: [id number] Endoscopist: Napoleon FormKavitha V. Nandigam , MD Age: [age] Referring MD:  Date of Birth: 02-15-1981 Gender: Male Account #: [id number] Procedure:                Colonoscopy Indications:              Evaluation of unexplained GI bleeding Medicines:                Monitored Anesthesia Care Procedure:                Pre-Anesthesia Assessment:                           - Prior to the procedure, a History and Physical                            was performed, and patient medications and                            allergies were reviewed. The patient's tolerance of                            previous anesthesia was also reviewed. The risks                            and benefits of the procedure and the sedation                            options and risks were discussed with the patient.                            All questions were answered, and informed consent                            was obtained. Prior Anticoagulants: The patient has                            taken no previous anticoagulant or antiplatelet                            agents. ASA Grade Assessment: II - A patient with                            mild systemic disease. After reviewing the risks                            and benefits, the patient was deemed in                            satisfactory condition to undergo the procedure.                           After obtaining informed consent, the colonoscope  was passed under direct vision. Throughout the                            procedure, the patient's blood pressure, pulse, and                            oxygen saturations were monitored continuously. The                            Model PCF-[id number]) scope was introduced                            through the anus and advanced to the the cecum,                            identified by  appendiceal orifice and ileocecal                            valve. The colonoscopy was performed without                            difficulty. The patient tolerated the procedure                            well. The quality of the bowel preparation was                            excellent. The ileocecal valve, appendiceal                            orifice, and rectum were photographed. Scope In: 9:18:50 AM Scope Out: 9:37:23 AM Scope Withdrawal Time: 0 hours 12 minutes 47 seconds  Total Procedure Duration: 0 hours 18 minutes 33 seconds  Findings:                 The perianal and digital rectal examinations were                            normal.                           A single small localized angiodysplastic lesion                            without bleeding was found in the cecum.                           Non-bleeding internal hemorrhoids were found during                            retroflexion. The hemorrhoids were medium-sized.                           The exam was otherwise without abnormality. Complications:            No immediate complications. Estimated  Blood Loss:     Estimated blood loss: none. Impression:               - A single non-bleeding colonic angiodysplastic                            lesion.                           - Non-bleeding internal hemorrhoids likely etiology                            of intermittent rectal bleeding.                           - The examination was otherwise normal.                           - No specimens collected. Recommendation:           - Patient has a contact number available for                            emergencies. The signs and symptoms of potential                            delayed complications were discussed with the                            patient. Return to normal activities tomorrow.                            Written discharge instructions were provided to the                            patient.                            - Resume previous diet.                           - Continue present medications.                           - Repeat colonoscopy at age 33 for screening                            purposes. Napoleon Form, MD 06/05/2017 9:41:03 AM This report has been signed electronically.

## 2017-06-06 ENCOUNTER — Telehealth: Payer: Self-pay

## 2017-06-06 NOTE — Telephone Encounter (Signed)
  Follow up Call-  Call back number 06/05/2017  Post procedure Call Back phone  # [phone]  Permission to leave phone message Yes  Some recent data might be hidden     Patient questions:  Do you have a fever, pain , or abdominal swelling? No. Pain Score  0 *  Have you tolerated food without any problems? Yes.    Have you been able to return to your normal activities? Yes.    Do you have any questions about your discharge instructions: Diet   No. Medications  No. Follow up visit  No.  Do you have questions or concerns about your Care? No.  Actions: * If pain score is 4 or above: No action needed, pain <4.  Per pr "everything went great". maw

## 2017-11-09 ENCOUNTER — Encounter (HOSPITAL_COMMUNITY): Payer: Self-pay

## 2017-11-09 ENCOUNTER — Ambulatory Visit (HOSPITAL_COMMUNITY)
Admission: EM | Admit: 2017-11-09 | Discharge: 2017-11-09 | Disposition: A | Discharge status: Home / Self Care | Payer: Medicaid Other | Attending: Family Medicine | Admitting: Family Medicine

## 2017-11-09 ENCOUNTER — Other Ambulatory Visit: Payer: Self-pay

## 2017-11-09 DIAGNOSIS — T63441A Toxic effect of venom of bees, accidental (unintentional), initial encounter: Secondary | ICD-10-CM | POA: Diagnosis not present

## 2017-11-09 MED ORDER — PREDNISONE 10 MG (21) PO TBPK: ORAL_TABLET | Freq: Every day | ORAL | 0 refills | Status: AC

## 2017-11-09 NOTE — ED Triage Notes (Signed)
[facility] since last night, pt thinks he may be having an allergic reaction due to rt arm swelling and numbness.

## 2017-11-09 NOTE — ED Provider Notes (Signed)
[facility]   [id number] 11/09/17 Arrival Time: 1330  ASSESSMENT & PLAN:  1. Bee sting, accidental or unintentional, initial encounter    Meds ordered this encounter  Medications  . predniSONE (STERAPRED UNI-PAK 21 TAB) 10 MG (21) TBPK tablet    Sig: Take by mouth daily. Take as directed.    Dispense:  21 tablet    Refill:  0   Benadryl if needed. Symptoms are improving. Watch for s/s of infection. Will f/u with PCP or here if not improving in the next 24 hours.   Reviewed expectations re: course of current medical issues. Questions answered. Outlined signs and symptoms indicating need for more acute intervention. Patient verbalized understanding. After Visit Summary given.   SUBJECTIVE: History from: patient.  Chase Hawkins is a 37 y.o. male who reports being stung by a bee last evening. R distal arm. Removed stinger. Arm "feels swollen". Mild erythema surrounding area of sting since being stung. No worsening. Afebrile. No swallowing or resp difficulties. No extremity sensation changes or weakness. No specific aggravating or alleviating factors reported. No n/v/abdominal pain. No OTC treatment.  ROS: As per HPI.   OBJECTIVE:  Vitals:   11/09/17 1456  BP: 108/72  Pulse: 60  Resp: 16  Temp: 97.7 F (36.5 C)  TempSrc: Oral  SpO2: 100%    General appearance: alert; no distress Neck: supple with FROM Lungs: unlabored respirations; symmetrical air entry Extremities: RUE with mild erythema near wrist (site of sting); no obvious swelling; warm to touch; wrist with FROM Skin: warm and dry Psychological: alert and cooperative; normal mood and affect  No Known Allergies  Past Medical History:  Diagnosis Date  . Hemorrhoids    Social History   Socioeconomic History  . Marital status: Single    Spouse name: Not on file  . Number of children: 4  . Years of education: Not on file  . Highest education level: Not on file  Occupational History  .  Occupation: Chef  Social Needs  . Financial resource strain: Not on file  . Food insecurity:    Worry: Not on file    Inability: Not on file  . Transportation needs:    Medical: Not on file    Non-medical: Not on file  Tobacco Use  . Smoking status: Current Every Day Smoker    Packs/day: 1.00    Types: Cigarettes  . Smokeless tobacco: Never Used  Substance and Sexual Activity  . Alcohol use: No    Comment: 2 years sober 05/23/2017  . Drug use: No  . Sexual activity: Not on file  Lifestyle  . Physical activity:    Days per week: Not on file    Minutes per session: Not on file  . Stress: Not on file  Relationships  . Social connections:    Talks on phone: Not on file    Gets together: Not on file    Attends religious service: Not on file    Active member of club or organization: Not on file    Attends meetings of clubs or organizations: Not on file    Relationship status: Not on file  . Intimate partner violence:    Fear of current or ex partner: Not on file    Emotionally abused: Not on file    Physically abused: Not on file    Forced sexual activity: Not on file  Other Topics Concern  . Not on file  Social History Narrative  . Not on file  Family History  Problem Relation Age of Onset  . Prostate cancer Father   . Diabetes Maternal Uncle    Past Surgical History:  Procedure Laterality Date  . NO PAST SURGERIES       Mardella LaymanHagler, Jazmyne Beauchesne, MD 11/13/17 [phone]
